# Patient Record
Sex: Male | Born: 1975 | Race: Black or African American | Hispanic: No | Marital: Married | Smoking: Former smoker
Health system: Southern US, Community
[De-identification: ages and names within clinical notes are randomized; demographics above are authoritative.]

## PROBLEM LIST (undated history)

## (undated) DIAGNOSIS — E7800 Pure hypercholesterolemia, unspecified: Secondary | ICD-10-CM

## (undated) DIAGNOSIS — I10 Essential (primary) hypertension: Secondary | ICD-10-CM

## (undated) DIAGNOSIS — E119 Type 2 diabetes mellitus without complications: Secondary | ICD-10-CM

## (undated) HISTORY — DX: Essential (primary) hypertension: I10

## (undated) HISTORY — DX: Pure hypercholesterolemia, unspecified: E78.00

## (undated) HISTORY — DX: Type 2 diabetes mellitus without complications: E11.9

## (undated) HISTORY — PX: OTHER SURGICAL HISTORY: SHX169

---

## 2008-03-23 ENCOUNTER — Encounter: Payer: Self-pay | Admitting: Endocrinology

## 2008-04-14 ENCOUNTER — Encounter: Payer: Self-pay | Admitting: Endocrinology

## 2008-04-20 DIAGNOSIS — Z8719 Personal history of other diseases of the digestive system: Secondary | ICD-10-CM

## 2008-04-20 DIAGNOSIS — M5137 Other intervertebral disc degeneration, lumbosacral region: Secondary | ICD-10-CM

## 2008-04-20 DIAGNOSIS — G609 Hereditary and idiopathic neuropathy, unspecified: Secondary | ICD-10-CM | POA: Insufficient documentation

## 2008-04-20 DIAGNOSIS — K219 Gastro-esophageal reflux disease without esophagitis: Secondary | ICD-10-CM

## 2008-04-21 ENCOUNTER — Ambulatory Visit: Payer: Self-pay | Admitting: Endocrinology

## 2008-04-21 DIAGNOSIS — K7689 Other specified diseases of liver: Secondary | ICD-10-CM

## 2008-04-21 DIAGNOSIS — E119 Type 2 diabetes mellitus without complications: Secondary | ICD-10-CM

## 2008-04-28 ENCOUNTER — Encounter: Payer: Self-pay | Admitting: Endocrinology

## 2008-05-06 ENCOUNTER — Ambulatory Visit: Payer: Self-pay | Admitting: Endocrinology

## 2008-06-11 ENCOUNTER — Ambulatory Visit: Payer: Self-pay | Admitting: Endocrinology

## 2008-06-15 ENCOUNTER — Encounter: Payer: Self-pay | Admitting: Endocrinology

## 2009-02-07 ENCOUNTER — Encounter: Payer: Self-pay | Admitting: Endocrinology

## 2009-02-08 ENCOUNTER — Encounter: Payer: Self-pay | Admitting: Endocrinology

## 2009-02-14 ENCOUNTER — Encounter: Payer: Self-pay | Admitting: Endocrinology

## 2009-02-18 ENCOUNTER — Encounter: Payer: Self-pay | Admitting: Endocrinology

## 2009-02-18 ENCOUNTER — Encounter (INDEPENDENT_AMBULATORY_CARE_PROVIDER_SITE_OTHER): Payer: Self-pay | Admitting: *Deleted

## 2009-02-18 ENCOUNTER — Telehealth: Payer: Self-pay | Admitting: Endocrinology

## 2009-02-19 ENCOUNTER — Encounter: Payer: Self-pay | Admitting: Endocrinology

## 2009-03-10 ENCOUNTER — Ambulatory Visit: Payer: Self-pay | Admitting: Endocrinology

## 2009-03-10 DIAGNOSIS — R079 Chest pain, unspecified: Secondary | ICD-10-CM

## 2009-03-10 DIAGNOSIS — E291 Testicular hypofunction: Secondary | ICD-10-CM

## 2009-03-10 DIAGNOSIS — N529 Male erectile dysfunction, unspecified: Secondary | ICD-10-CM | POA: Insufficient documentation

## 2009-04-11 ENCOUNTER — Ambulatory Visit: Payer: Self-pay | Admitting: Endocrinology

## 2009-04-11 DIAGNOSIS — IMO0001 Reserved for inherently not codable concepts without codable children: Secondary | ICD-10-CM

## 2009-04-11 LAB — CONVERTED CEMR LAB
Prolactin: 12.9 ng/mL
Sed Rate: 23 mm/hr — ABNORMAL HIGH (ref 0–22)
Total CK: 122 units/L (ref 7–232)

## 2009-05-24 ENCOUNTER — Ambulatory Visit: Payer: Self-pay | Admitting: Endocrinology

## 2009-05-24 DIAGNOSIS — M25559 Pain in unspecified hip: Secondary | ICD-10-CM

## 2009-05-24 DIAGNOSIS — R109 Unspecified abdominal pain: Secondary | ICD-10-CM

## 2009-05-25 ENCOUNTER — Telehealth (INDEPENDENT_AMBULATORY_CARE_PROVIDER_SITE_OTHER): Payer: Self-pay | Admitting: *Deleted

## 2009-05-26 ENCOUNTER — Ambulatory Visit: Payer: Self-pay | Admitting: Cardiovascular Disease

## 2009-05-26 LAB — CONVERTED CEMR LAB
Calcium: 9 mg/dL (ref 8.4–10.5)
GFR calc non Af Amer: 165.92 mL/min (ref >60)
Ketones, ur: NEGATIVE mg/dL
Leukocytes, UA: NEGATIVE
Sed Rate: 27 mm/hr — ABNORMAL HIGH (ref 0–22)
Sodium: 143 meq/L (ref 135–145)
Specific Gravity, Urine: 1.02 (ref 1.000–1.030)
Urobilinogen, UA: 1 (ref 0.0–1.0)

## 2009-06-27 ENCOUNTER — Telehealth: Payer: Self-pay | Admitting: Endocrinology

## 2009-07-12 ENCOUNTER — Encounter: Payer: Self-pay | Admitting: Endocrinology

## 2009-07-21 ENCOUNTER — Encounter: Payer: Self-pay | Admitting: Endocrinology

## 2009-08-10 ENCOUNTER — Ambulatory Visit (HOSPITAL_COMMUNITY): Admission: RE | Admit: 2009-08-10 | Discharge: 2009-08-10 | Payer: Self-pay | Admitting: Orthopedic Surgery

## 2009-08-13 ENCOUNTER — Encounter: Admission: RE | Admit: 2009-08-13 | Discharge: 2009-08-13 | Payer: Self-pay | Admitting: Orthopedic Surgery

## 2009-11-09 ENCOUNTER — Ambulatory Visit: Payer: Self-pay | Admitting: Endocrinology

## 2011-01-08 ENCOUNTER — Encounter: Payer: Self-pay | Admitting: Orthopedic Surgery

## 2011-12-18 HISTORY — PX: OTHER SURGICAL HISTORY: SHX169

## 2015-09-13 ENCOUNTER — Encounter: Payer: Self-pay | Admitting: *Deleted

## 2015-09-14 ENCOUNTER — Ambulatory Visit (INDEPENDENT_AMBULATORY_CARE_PROVIDER_SITE_OTHER): Payer: Medicare Other | Admitting: Diagnostic Neuroimaging

## 2015-09-14 ENCOUNTER — Encounter: Payer: Self-pay | Admitting: Diagnostic Neuroimaging

## 2015-09-14 VITALS — BP 128/79 | HR 112 | Ht 71.5 in | Wt 251.8 lb

## 2015-09-14 DIAGNOSIS — M5414 Radiculopathy, thoracic region: Secondary | ICD-10-CM | POA: Diagnosis not present

## 2015-09-14 MED ORDER — GABAPENTIN 300 MG PO CAPS: 300.0000 mg | ORAL_CAPSULE | Freq: Three times a day (TID) | ORAL | Status: DC

## 2015-09-14 NOTE — Patient Instructions (Signed)
Thank you for coming to see [place] at [facility]. I hope we have been able to provide you high quality care today.  You may receive a patient satisfaction survey over the next few weeks. We would appreciate your feedback and comments so that we may continue to improve ourselves and the health of our patients.   - try over the counter capsaicin cream; apply 2-3 x per day; use gloves; try for 2-4 weeks - will prescribe compounded neuropathy cream - try gabapentin 320m at bedtime; gradually increase to 3 times per day; see goodrx.com for discount coupons - I will check MRI thoracic spine   ~~~~~~~~~~~~~~~~~~~~~~~~~~~~~~~~~~~~~~~~~~~~~~~~~~~~~~~~~~~~~~~~~  DR. PENUMALLI'S GUIDE TO HAPPY AND HEALTHY LIVING These are some of my general health and wellness recommendations. Some of them may apply to you better than others. Please use common sense as you try these suggestions and feel free to ask me any questions.   ACTIVITY/FITNESS Mental, social, emotional and physical stimulation are very important for brain and body health. Try learning a new activity (arts, music, language, sports, games).  Keep moving your body to the best of your abilities. You can do this at home, inside or outside, the park, community center, gym or anywhere you like. Consider a physical therapist or personal trainer to get started. Consider the app Sworkit. Fitness trackers such as smart-watches, smart-phones or Fitbits can help as well.   NUTRITION Eat more plants: colorful vegetables, nuts, seeds and berries.  Eat less sugar, salt, preservatives and processed foods.  Avoid toxins such as cigarettes and alcohol.  Drink water when you are thirsty. Warm water with a slice of lemon is an excellent morning drink to start the day.  Consider these websites for more information The Nutrition Source (hhttps://www.henry-hernandez.biz/ [employer] Nutrition  (wWindowBlog.ch   RELAXATION Consider practicing mindfulness meditation or other relaxation techniques such as deep breathing, prayer, yoga, tai chi, massage. See website mindful.org or the apps Headspace or Calm to help get started.   SLEEP Try to get at least 7-8+ hours sleep per day. Regular exercise and reduced caffeine will help you sleep better. Practice good sleep hygeine techniques. See website sleep.org for more information.   PLANNING Prepare estate planning, living will, healthcare POA documents. Sometimes this is best planned with the help of an attorney. Theconversationproject.org and agingwithdignity.org are excellent resources.

## 2015-09-14 NOTE — Progress Notes (Signed)
[facility]  PATIENT: Nathan Garner DOB: Dec 19, 1975  REFERRING CLINICIAN: Margaretmary Bayley HISTORY FROM: patient and wife  REASON FOR VISIT: new consult    HISTORICAL  CHIEF COMPLAINT:  Chief Complaint  Patient presents with  . Dysesthesia    rm 6, New patient, wife- Charlene    HISTORY OF PRESENT ILLNESS:   39 year old right-handed male here for evaluation of pain in the right chest, axillary and back region.  2014 patient had shingles affecting the right flank and abdominal region. He had some sequelae of small rash hyperpigmented regions that have remained. No burning or postherpetic neuralgia pain.  One year ago patient had onset of pain superior to the region of shingles from 2014, affecting just below the right nipple region, radiating around the right lateral and posterior upper thoracic region. Symptoms have been worsening over the past year. He has very hypersensitive skin and even light touch from his clothing causes severe intense burning pain. He has noted no rash in the region of current pain. No prodromal traumas, accidents, rash, infections. Her graft of note patient has diagnosis of hypertension, diabetes, hypercholesterolemia. He has not been on medication for these conditions over the past few years. He did not have insurance coverage and did not take prescription medications as a result. Current hemoglobin A1c is over 13. He is now reestablished with his PCP, has insurance and prescription coverage which should reactivate in November 2016. Patient was prescribed Lyrica for his current pain problems but he has not filled it as the out-of-pocket cost is over $400 per month.  No problems with his upper or lower extremities. No extremity weakness. He has some tingling in his toes.   REVIEW OF SYSTEMS: Full 14 system review of systems performed and notable only for insomnia headache numbness joint pain cramps aching muscles diarrhea shortest of breath  chest pain weight loss fevers chills.  ALLERGIES: Allergies  Allergen Reactions  . Bee Venom Anaphylaxis  . Lisinopril Anaphylaxis    Other reaction(s): DIFFICULTY SWALLOWING  . Promethazine Nausea And Vomiting    Other reaction(s): NAUSEA  . Niacin-Lovastatin Er     REACTION: ANYTHING WITH NIACIN    HOME MEDICATIONS: No outpatient prescriptions prior to visit.   No facility-administered medications prior to visit.    PAST MEDICAL HISTORY: Past Medical History  Diagnosis Date  . Diabetes     type 2  . Hypertension   . Hypercholesterolemia     PAST SURGICAL HISTORY: Past Surgical History  Procedure Laterality Date  . Lap choley  2013  . Abscess removal Bilateral     inner thighs x 7    FAMILY HISTORY: Family History  Problem Relation Age of Onset  . Diabetes Mother   . Cancer Maternal Uncle   . Diabetes Maternal Grandmother   . Hypertension Maternal Grandmother   . Cancer Maternal Grandmother     SOCIAL HISTORY:  Social History   Social History  . Marital Status: Married    Spouse Name: [name]  . Number of Children: 1  . Years of Education: 12   Occupational History  .      [employer]   Social History Main Topics  . Smoking status: Current Some Day Smoker -- 0.30 packs/day for 18 years    Types: Cigarettes  . Smokeless tobacco: Not on file  . Alcohol Use: 0.0 oz/week    0 Standard drinks or equivalent per week     Comment: monthly beer  . Drug  Use: No  . Sexual Activity: Not on file   Other Topics Concern  . Not on file   Social History Narrative   Lives with wife   caffeine use- Monster energy drinks, 2 daily     PHYSICAL EXAM  GENERAL EXAM/CONSTITUTIONAL: Vitals:  Filed Vitals:   09/14/15 1516  BP: 128/79  Pulse: 112  Height: 5' 11.5" (1.816 m)  Weight: 251 lb 12.8 oz (114.216 kg)     Body mass index is 34.63 kg/(m^2).  Visual Acuity Screening   Right eye Left eye Both eyes  Without correction:     With  correction: 20/40 20/20      Patient is in no distress; well developed, nourished and groomed; neck is supple  CARDIOVASCULAR:  Examination of carotid arteries is normal; no carotid bruits  Regular rate and rhythm, no murmurs  Examination of peripheral vascular system by observation and palpation is normal  EYES:  Ophthalmoscopic exam of optic discs and posterior segments is normal; no papilledema or hemorrhages  MUSCULOSKELETAL:  Gait, strength, tone, movements noted in Neurologic exam below  NEUROLOGIC: MENTAL STATUS:  No flowsheet data found.  awake, alert, oriented to person, place and time  recent and remote memory intact  normal attention and concentration  language fluent, comprehension intact, naming intact,   fund of knowledge appropriate  CRANIAL NERVE:   2nd - no papilledema on fundoscopic exam  2nd, 3rd, 4th, 6th - pupils equal and reactive to light, visual fields full to confrontation, extraocular muscles intact, no nystagmus  5th - facial sensation symmetric  7th - facial strength symmetric  8th - hearing intact  9th - palate elevates symmetrically, uvula midline  11th - shoulder shrug symmetric  12th - tongue protrusion midline  MOTOR:   normal bulk and tone, full strength in the BUE, BLE  SENSORY:   normal and symmetric to light touch, pinprick, temperature, vibration; EXCEPT FOR ALLODYNIA IN LEFT T4, T5 REGIONS  COORDINATION:   finger-nose-finger, fine finger movements normal  REFLEXES:   deep tendon reflexes TRACE and symmetric  GAIT/STATION:   narrow based gait; romberg is negative    DIAGNOSTIC DATA (LABS, IMAGING, TESTING) - I reviewed patient records, labs, notes, testing and imaging myself where available.  No results found for: WBC, HGB, HCT, MCV, PLT    Component Value Date/Time   NA 143 05/24/2009 1713   K 4.0 05/24/2009 1713   CL 107 05/24/2009 1713   CO2 30 05/24/2009 1713   GLUCOSE 116* 05/24/2009 1713     BUN 11 05/24/2009 1713   CREATININE 0.6 05/24/2009 1713   CALCIUM 9.0 05/24/2009 1713   GFRNONAA 165.92 05/24/2009 1713   No results found for: CHOL, HDL, LDLCALC, LDLDIRECT, TRIG, CHOLHDL Lab Results  Component Value Date   HGBA1C 9.5* 04/11/2009   No results found for: VITAMINB12 No results found for: TSH   08/13/09 MRI lumbar spine [I reviewed images myself and agree with interpretation. -VRP]  - Minimal disc bulge at L5-S1. Minimal facet degeneration. No neural compressive pathology. The findings could possibly relate to back pain.    ASSESSMENT AND PLAN  38 y.o. year old male here with new onset right thoracic, T4, T5 radiculopathy/neuralgia symptoms since 2015. May represent postherpetic neuralgia, diabetic radiculopathy, compressive radiculopathy. We'll check MRI thoracic spine for further evaluation. We'll try gabapentin and compounded neuropathy cream.  Dx: Thoracic radiculopathy - Plan: MR Thoracic Spine Wo Contrast    PLAN: - try over the counter capsaicin cream; apply 2-3  x per day; use gloves; try for 2-4 weeks - will prescribe compounded neuropathy cream - try gabapentin  at bedtime; gradually increase to 3 times per day; see goodrx.com for discount coupons - I will check MRI thoracic spine  Orders Placed This Encounter  Procedures  . MR Thoracic Spine Wo Contrast    Meds ordered this encounter  Medications  . gabapentin (NEURONTIN) 300 MG capsule    Sig: Take 1 capsule (300 mg total) by mouth 3 (three) times daily.    Dispense:  90 capsule    Refill:  6    Return in about 6 weeks (around 10/26/2015).    Suanne Marker, MD 09/14/2015, 4:20 PM Certified in Neurology, Neurophysiology and Neuroimaging  [facility] [address] [place], [place] [address] [phone]

## 2015-09-15 ENCOUNTER — Encounter: Payer: Self-pay | Admitting: *Deleted

## 2015-09-15 NOTE — Addendum Note (Signed)
Addended by: [place] Pink on: 09/15/2015 08:50 AM   Modules accepted: Medications

## 2015-10-05 ENCOUNTER — Ambulatory Visit
Admission: RE | Admit: 2015-10-05 | Discharge: 2015-10-05 | Disposition: A | Discharge status: Home / Self Care | Payer: Medicare Other | Source: Ambulatory Visit | Attending: Diagnostic Neuroimaging | Admitting: Diagnostic Neuroimaging

## 2015-10-05 DIAGNOSIS — M5414 Radiculopathy, thoracic region: Secondary | ICD-10-CM | POA: Diagnosis not present

## 2015-10-10 ENCOUNTER — Telehealth: Payer: Self-pay | Admitting: Diagnostic Neuroimaging

## 2015-10-10 NOTE — Telephone Encounter (Signed)
Continue increasing gabapentin up to 300mg  TID for 1-2 weeks, then up to 600mg  TID.

## 2015-10-10 NOTE — Telephone Encounter (Signed)
Spoke with patient and informed him of Dr Penumalli's response. Advised he may choose to increase Gabapentin to 600 mg at night only to see if it is helpful. Advised he take 300 mg TID x 1 week at minimum before he increases and adjust dose according to how well his pain is being controlled. Advised he call back with any questions and touch base in 1-2 weeks if he has not heard back from this RN before then. He verbalized understanding, appreciation.

## 2015-10-10 NOTE — Telephone Encounter (Signed)
Spoke with patient and informed him that thoracic MRI results (without contrast) are normal. He states the compounded cream is "only helpful for about 10 minutes". He increased Gabapentin to 300 mg BID approximately 3 days after he began medication. He has not increased to 300 mg TID. He states his pain has "spread to his entire trunk and back and into his lower back". He states "the pain is so bad sometimes it makes my back sweat". Advised he increase Gabapentin to 300 mg TID and this RN will route his c/o to Dr Marjory LiesPenumalli for further instructions, information. Informed patient would try to call him back by close today, but definitely by 5 pm tomorrow. He verbalized understanding, appreciation for call.

## 2015-10-10 NOTE — Telephone Encounter (Signed)
Pt's wife called on behalf to the pt. He would like to know results of his MRI. Please call and advise [phone]

## 2015-10-10 NOTE — Telephone Encounter (Signed)
Agree with plan. --VRP 

## 2015-10-27 ENCOUNTER — Encounter: Payer: Self-pay | Admitting: Diagnostic Neuroimaging

## 2015-10-27 ENCOUNTER — Ambulatory Visit (INDEPENDENT_AMBULATORY_CARE_PROVIDER_SITE_OTHER): Payer: Medicare Other | Admitting: Diagnostic Neuroimaging

## 2015-10-27 VITALS — BP 153/85 | HR 104 | Ht 71.5 in | Wt 254.0 lb

## 2015-10-27 DIAGNOSIS — R1011 Right upper quadrant pain: Secondary | ICD-10-CM | POA: Diagnosis not present

## 2015-10-27 DIAGNOSIS — R0789 Other chest pain: Secondary | ICD-10-CM

## 2015-10-27 DIAGNOSIS — R1032 Left lower quadrant pain: Secondary | ICD-10-CM | POA: Diagnosis not present

## 2015-10-27 MED ORDER — PREGABALIN 100 MG PO CAPS: 100.0000 mg | ORAL_CAPSULE | Freq: Two times a day (BID) | ORAL | Status: DC

## 2015-10-27 NOTE — Progress Notes (Signed)
[facility]  PATIENT: Nathan DellClifton Netz DOB: [birth year]  REFERRING CLINICIAN: Margaretmary BayleyPreston Clark HISTORY FROM: patient and wife  REASON FOR VISIT: follow up    HISTORICAL  CHIEF COMPLAINT:  Chief Complaint  Patient presents with  . Thoracic radiculopathy    rm 6  . Follow-up    6 week    HISTORY OF PRESENT ILLNESS:   UPDATE 10/27/15: Since last visit, continutes with severe pain. Now with increasing pain in left abdomen. More tachycardia and diaphoresis. Now on lyrica (now has insurance).   PRIOR HPI (09/14/15): 39 year old right-handed male here for evaluation of pain in the right chest, axillary and back region. 2014 patient had shingles affecting the right flank and abdominal region. He had some sequelae of small rash hyperpigmented regions that have remained. No burning or postherpetic neuralgia pain. One year ago patient had onset of pain superior to the region of shingles from 2014, affecting just below the right nipple region, radiating around the right lateral and posterior upper thoracic region. Symptoms have been worsening over the past year. He has very hypersensitive skin and even light touch from his clothing causes severe intense burning pain. He has noted no rash in the region of current pain. No prodromal traumas, accidents, rash, infections. Her graft of note patient has diagnosis of hypertension, diabetes, hypercholesterolemia. He has not been on medication for these conditions over the past few years. He did not have insurance coverage and did not take prescription medications as a result. Current hemoglobin A1c is over 13. He is now reestablished with his PCP, has insurance and prescription coverage which should reactivate in November 2016. Patient was prescribed Lyrica for his current pain problems but he has not filled it as the out-of-pocket cost is over $400 per month. No problems with his upper or lower extremities. No extremity weakness. He has some  tingling in his toes.   REVIEW OF SYSTEMS: Full 14 system review of systems performed and notable only for as per HPI.    ALLERGIES: Allergies  Allergen Reactions  . Bee Venom Anaphylaxis  . Lisinopril Anaphylaxis    Other reaction(s): DIFFICULTY SWALLOWING  . Promethazine Nausea And Vomiting    Other reaction(s): NAUSEA  . Niacin-Lovastatin Er     REACTION: ANYTHING WITH NIACIN    HOME MEDICATIONS: Outpatient Prescriptions Prior to Visit  Medication Sig Dispense Refill  . Linagliptin-Metformin HCl (JENTADUETO PO) Take 1,000 mg by mouth 2 (two) times daily.    . NONFORMULARY OR COMPOUNDED ITEM Baclofen 2%, Gabapentin 6%, Imipramine 3%, Lidocaine 2%, Nifedipine 2% Neuropathic/post herpetic Neuralgia Cream, [facility] refills    . gabapentin (NEURONTIN) 300 MG capsule Take 1 capsule (300 mg total) by mouth 3 (three) times daily. (Patient not taking: Reported on 10/27/2015) 90 capsule 6   No facility-administered medications prior to visit.    PAST MEDICAL HISTORY: Past Medical History  Diagnosis Date  . Diabetes (HCC)     type 2  . Hypertension   . Hypercholesterolemia     PAST SURGICAL HISTORY: Past Surgical History  Procedure Laterality Date  . Lap choley  2013  . Abscess removal Bilateral     inner thighs x 7    FAMILY HISTORY: Family History  Problem Relation Age of Onset  . Diabetes Mother   . Cancer Maternal Uncle   . Diabetes Maternal Grandmother   . Hypertension Maternal Grandmother   . Cancer Maternal Grandmother     SOCIAL HISTORY:  Social History   Social  History  . Marital Status: Married    Spouse Name: [name]  . Number of Children: 1  . Years of Education: 12   Occupational History  .      [employer]   Social History Main Topics  . Smoking status: Current Some Day Smoker -- 0.30 packs/day for 18 years    Types: Cigarettes  . Smokeless tobacco: Not on file  . Alcohol Use: 0.0 oz/week    0 Standard drinks or  equivalent per week     Comment: monthly beer  . Drug Use: No  . Sexual Activity: Not on file   Other Topics Concern  . Not on file   Social History Narrative   Lives with wife   caffeine use- Monster energy drinks, 2 daily     PHYSICAL EXAM  GENERAL EXAM/CONSTITUTIONAL: Vitals:  Filed Vitals:   10/27/15 1546  BP: 153/85  Pulse: 104  Height: 5' 11.5" (1.816 m)  Weight: 254 lb (115.214 kg)   Body mass index is 34.94 kg/(m^2). No exam data present    Patient is UNCOMFORTABLE APPEARING; DIAPHORETIC   Well developed, nourished and groomed; neck is supple  CARDIOVASCULAR:  Examination of carotid arteries is normal; no carotid bruits  Regular rate and rhythm, no murmurs  Examination of peripheral vascular system by observation and palpation is normal  EYES:  Ophthalmoscopic exam of optic discs and posterior segments is normal; no papilledema or hemorrhages  MUSCULOSKELETAL:  Gait, strength, tone, movements noted in Neurologic exam below  NEUROLOGIC: MENTAL STATUS:  No flowsheet data found.  awake, alert, oriented to person, place and time  recent and remote memory intact  normal attention and concentration  language fluent, comprehension intact, naming intact,   fund of knowledge appropriate  CRANIAL NERVE:   2nd - no papilledema on fundoscopic exam  2nd, 3rd, 4th, 6th - pupils equal and reactive to light, visual fields full to confrontation, extraocular muscles intact, no nystagmus  5th - facial sensation symmetric  7th - facial strength symmetric  8th - hearing intact  9th - palate elevates symmetrically, uvula midline  11th - shoulder shrug symmetric  12th - tongue protrusion midline  MOTOR:   normal bulk and tone, full strength in the BUE, BLE  SENSORY:   normal and symmetric to light touch; SENSITIVE IN RIGHT T4,5 AND LEFT T9-11 REGIONS  COORDINATION:   finger-nose-finger, fine finger movements normal  REFLEXES:   deep  tendon reflexes TRACE and symmetric  GAIT/STATION:   narrow based gait; romberg is negative    DIAGNOSTIC DATA (LABS, IMAGING, TESTING) - I reviewed patient records, labs, notes, testing and imaging myself where available.  No results found for: WBC, HGB, HCT, MCV, PLT    Component Value Date/Time   NA 143 05/24/2009 1713   K 4.0 05/24/2009 1713   CL 107 05/24/2009 1713   CO2 30 05/24/2009 1713   GLUCOSE 116* 05/24/2009 1713   BUN 11 05/24/2009 1713   CREATININE 0.6 05/24/2009 1713   CALCIUM 9.0 05/24/2009 1713   GFRNONAA 165.92 05/24/2009 1713   No results found for: CHOL, HDL, LDLCALC, LDLDIRECT, TRIG, CHOLHDL Lab Results  Component Value Date   HGBA1C 9.5* 04/11/2009   No results found for: VITAMINB12 No results found for: TSH    08/13/09 MRI lumbar spine [I reviewed images myself and agree with interpretation. -VRP]  - Minimal disc bulge at L5-S1. Minimal facet degeneration. No neural compressive pathology. The findings could possibly relate to back pain.  10/05/15 MRI thoracic spine  - normal      ASSESSMENT AND PLAN  39 y.o. year old male here with new onset right thoracic, T4, T5 radiculopathy/neuralgia symptoms since 2015. May represent diabetic radiculopathy. Now with increasing left abdomen pain, tachycardia, diaphoresis.    Dx:  Left lower quadrant pain - Plan: CT Abdomen W Contrast, CANCELED: CT Abdomen Pelvis W Contrast  Other chest pain - Plan: CT ANGIO CHEST AORTA W/CM &/OR WO/CM, CANCELED: CT Abdomen Pelvis W Contrast  Right upper quadrant pain - Plan: CT Abdomen W Contrast, CT ANGIO CHEST AORTA W/CM &/OR WO/CM, CANCELED: CT Abdomen Pelvis W Contrast    PLAN: - increase lyrica to  BID - continue compounded neuropathy cream - check CT chest and abdomen (rule out aorta dissection; rule out abdomen pathology)  Orders Placed This Encounter  Procedures  . CT Abdomen W Contrast  . CT ANGIO CHEST AORTA W/CM &/OR WO/CM   Meds  ordered this encounter  Medications  . pregabalin (LYRICA) 100 MG capsule    Sig: Take 1 capsule (100 mg total) by mouth 2 (two) times daily.    Dispense:  60 capsule    Refill:  5   Return in about 6 weeks (around 12/08/2015).    Suanne Marker, MD 10/27/2015, 4:52 PM Certified in Neurology, Neurophysiology and Neuroimaging  [facility] [address] [place], [place] [address] [phone]

## 2015-10-27 NOTE — Patient Instructions (Signed)
-   increase lyrica to 100mg  twice a day - I will check CT abdomen

## 2015-12-01 ENCOUNTER — Ambulatory Visit (INDEPENDENT_AMBULATORY_CARE_PROVIDER_SITE_OTHER): Payer: Self-pay | Admitting: Family Medicine

## 2015-12-01 VITALS — BP 138/90 | HR 103 | Temp 97.7°F | Resp 17 | Ht 70.0 in | Wt 246.0 lb

## 2015-12-01 DIAGNOSIS — IMO0002 Reserved for concepts with insufficient information to code with codable children: Secondary | ICD-10-CM

## 2015-12-01 DIAGNOSIS — Z23 Encounter for immunization: Secondary | ICD-10-CM

## 2015-12-01 DIAGNOSIS — IMO0001 Reserved for inherently not codable concepts without codable children: Secondary | ICD-10-CM

## 2015-12-01 DIAGNOSIS — W278XXA Contact with other nonpowered hand tool, initial encounter: Secondary | ICD-10-CM

## 2015-12-01 DIAGNOSIS — Z114 Encounter for screening for human immunodeficiency virus [HIV]: Secondary | ICD-10-CM

## 2015-12-01 DIAGNOSIS — S61439A Puncture wound without foreign body of unspecified hand, initial encounter: Secondary | ICD-10-CM

## 2015-12-01 DIAGNOSIS — S61432A Puncture wound without foreign body of left hand, initial encounter: Secondary | ICD-10-CM

## 2015-12-01 DIAGNOSIS — S6982XA Other specified injuries of left wrist, hand and finger(s), initial encounter: Secondary | ICD-10-CM

## 2015-12-01 NOTE — Progress Notes (Signed)
Subjective:  This chart was scribed for Meredith Staggers, MD by Stann Ore, Medical Scribe. This patient was seen in room 13 and the patient's care was started 6:17 PM.   Patient ID: Nathan Garner, male    DOB: 05-15-1976, 39 y.o.   MRN: [id number] Chief Complaint  Patient presents with  . Hand Injury    left hand injury , has a needle inside the nail    HPI Nathan Garner is a 39 y.o. male  Pt reports of left hand injury into his left thumb and right hand today. He was moving furniture for work today in [place]. While he was moving a couch, an insulin needle stabbed into his left thumb, and lantus punctured into his right palm with blood in both punctures. The client was diabetic without knowledge to the patient until incident. The patient was wearing nitrile coated cloth gloves and the needle and the lantus went through the gloves. He immediately took his gloves off and removed the needle in his thumb with tweezers. He used rubbing alcohol, washed his hands with soap and water, and applied neosporin to the area.   The patient did note that the client was an older gentleman and wasn't expecting the patient to arrive to his home so early.   His last tdap was more than 5 years ago.  He works for [employer].   Patient Active Problem List   Diagnosis Date Noted  . HIP PAIN, LEFT 05/24/2009  . PELVIC  PAIN 05/24/2009  . MYALGIA 04/11/2009  . HYPOGONADISM 03/10/2009  . 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 CHEST PAIN 03/10/2009  . DM 04/21/2008  . FATTY LIVER DISEASE 04/21/2008  . PERIPHERAL NEUROPATHY 04/20/2008  . GERD 04/20/2008  . DEGENERATIVE DISC DISEASE, LUMBOSACRAL SPINE 04/20/2008  . HEMORRHOIDS, HX OF 04/20/2008   Past Medical History  Diagnosis Date  . Diabetes (HCC)     type 2  . Hypertension   . Hypercholesterolemia    Past Surgical History  Procedure Laterality Date  . Lap choley  2013  . Abscess removal Bilateral     inner thighs x 7   Allergies    Allergen Reactions  . Bee Venom Anaphylaxis  . Lisinopril Anaphylaxis    Other reaction(s): DIFFICULTY SWALLOWING  . Promethazine Nausea And Vomiting    Other reaction(s): NAUSEA  . Niacin-Lovastatin Er     REACTION: ANYTHING WITH NIACIN   Prior to Admission medications   Medication Sig Start Date End Date Taking? Authorizing Provider  lidocaine (XYLOCAINE) 5 % ointment Reported on 12/01/2015 10/25/15   Historical Provider, MD  Linagliptin-Metformin HCl (JENTADUETO PO) Take 1,000 mg by mouth 2 (two) times daily. Reported on 12/01/2015    Historical Provider, MD  NONFORMULARY OR COMPOUNDED ITEM Reported on 12/01/2015 09/15/15   Historical Provider, MD  pregabalin (LYRICA) 100 MG capsule Take 1 capsule (100 mg total) by mouth 2 (two) times daily. Patient not taking: Reported on 12/01/2015 10/27/15   Suanne Marker, MD   Social History   Social History  . Marital Status: Married    Spouse Name: [name]  . Number of Children: 1  . Years of Education: 12   Occupational History  .      [employer]   Social History Main Topics  . Smoking status: Current Some Day Smoker -- 0.30 packs/day for 18 years    Types: Cigarettes  . Smokeless tobacco: Not on file  . Alcohol Use: 0.0 oz/week  0 Standard drinks or equivalent per week     Comment: monthly beer  . Drug Use: No  . Sexual Activity: Not on file   Other Topics Concern  . Not on file   Social History Narrative   Lives with wife   caffeine use- Monster energy drinks, 2 daily   Review of Systems  Constitutional: Negative for fever, chills and fatigue.  Gastrointestinal: Negative for nausea, vomiting, diarrhea and constipation.  Musculoskeletal: Negative for myalgias, joint swelling and arthralgias.  Skin: Positive for wound. Negative for rash.  Neurological: Negative for dizziness.      Objective:   Physical Exam  Constitutional: He is oriented to person, place, and time. He appears well-developed and  well-nourished. No distress.  HENT:  Head: Normocephalic and atraumatic.  Eyes: EOM are normal. Pupils are equal, round, and reactive to light.  Neck: Neck supple.  Cardiovascular: Normal rate.   Pulmonary/Chest: Effort normal. No respiratory distress.  Musculoskeletal: Normal range of motion.  Left thumb, radial border: slight spacing and nail lifting from what-appears to be an injury, no active bleeding, no discharge, remainder of nail bed intact, no foreign body noted  Right hand: small puncture at the volar aspect of the hand overlying 3rd mcp, no bleeding, no active discharge, no foreign body noted  Neurological: He is alert and oriented to person, place, and time.  Skin: Skin is warm and dry.  Psychiatric: He has a normal mood and affect. His behavior is normal.  Nursing note and vitals reviewed.   Filed Vitals:   12/01/15 1806  BP: 138/90  Pulse: 103  Temp: 97.7 F (36.5 C)  TempSrc: Oral  Resp: 17  Height: 5\' 10"  (1.778 m)  Weight: 246 lb (111.585 kg)  SpO2: 98%      Assessment & Plan:  Nathan LongestClifton T Nathan Garner is a 39 y.o. male Puncture wound, hand, unspecified laterality, initial encounter - Plan: HIV antibody, ALT, Hepatitis C antibody, Hepatitis B surface antibody, Hepatitis B surface antigen  Needlestick wound of finger, left, initial encounter - Plan: HIV antibody, ALT, Hepatitis C antibody, Hepatitis B surface antibody, Hepatitis B surface antigen  Needlestick injury accident - Plan: HIV antibody, ALT, Hepatitis C antibody, Hepatitis B surface antibody, Hepatitis B surface antigen  Screening for HIV (human immunodeficiency virus) - Plan: HIV antibody, ALT, Hepatitis C antibody, Hepatitis B surface antibody, Hepatitis B surface antigen  Need for Tdap vaccination - Plan: Tdap vaccine greater than or equal to 7yo IM  Accidental needlestick to thumb and lancet puncture to volar hand as above. Source patient status unknown. His employer will attempt to get this information,  but for now - baseline/screening labs above. Lower risk or disease transmission based on insulin needle, but discussed still present risk.  Discussed with Benny LennertSarah Weber, PA-C who will try to assist in next 2 days to obtain labs from source patient. rtc precautions discussed including potential repeat labs in 6 weeks. Understanding expressed.   No orders of the defined types were placed in this encounter.   Patient Instructions  As discussed, low risk of exposure to infection based on the type of injury you had, but will check for baseline labs in both you and ideally the client's blood work as he would be the source patient.  Benny LennertSarah Weber will be in touch with you tomorrow, so if possible try to obtain the information from the client so that she can call him regarding the next step and further testing. I have given you both  her card and my card for Nurse, children's.  Tetanus vaccine was given tonight.   Okay to continue cleansing wounds with soap and water, Polysporin or Neosporin okay if needed, but a simple bandage keeping them clean and covered at work should suffice.  Return to the clinic or go to the nearest emergency room if any of your symptoms worsen or new symptoms occur.        By signing my name below, I, Stann Ore, attest that this documentation has been prepared under the direction and in the presence of Meredith Staggers, MD. Electronically Signed: Stann Ore, Scribe. 12/01/2015 , 6:17 PM .  I personally performed the services described in this documentation, which was scribed in my presence. The recorded information has been reviewed and considered, and addended by me as needed.

## 2015-12-01 NOTE — Patient Instructions (Signed)
As discussed, low risk of exposure to infection based on the type of injury you had, but will check for baseline labs in both you and ideally the client's blood work as he would be the source patient.  Nathan LennertSarah Garner will be in touch with you tomorrow, so if possible try to obtain the information from the client so that she can call him regarding the next step and further testing. I have given you both her card and my card for your manager.  Tetanus vaccine was given tonight.   Okay to continue cleansing wounds with soap and water, Polysporin or Neosporin okay if needed, but a simple bandage keeping them clean and covered at work should suffice.  Return to the clinic or go to the nearest emergency room if any of your symptoms worsen or new symptoms occur.

## 2015-12-02 ENCOUNTER — Telehealth: Payer: Self-pay | Admitting: Physician Assistant

## 2015-12-02 DIAGNOSIS — Z7721 Contact with and (suspected) exposure to potentially hazardous body fluids: Secondary | ICD-10-CM

## 2015-12-02 DIAGNOSIS — IMO0001 Reserved for inherently not codable concepts without codable children: Secondary | ICD-10-CM

## 2015-12-02 DIAGNOSIS — S6981XA Other specified injuries of right wrist, hand and finger(s), initial encounter: Secondary | ICD-10-CM

## 2015-12-02 LAB — ALT: ALT: 18 U/L (ref 9–46)

## 2015-12-02 MED ORDER — EMTRICITABINE-TENOFOVIR DF 200-300 MG PO TABS: 1.0000 | ORAL_TABLET | Freq: Every day | ORAL | Status: DC

## 2015-12-02 MED ORDER — RALTEGRAVIR POTASSIUM 400 MG PO TABS: 400.0000 mg | ORAL_TABLET | Freq: Two times a day (BID) | ORAL | Status: DC

## 2015-12-02 NOTE — Telephone Encounter (Signed)
Spoke with patient - his company talked to the source patient today and he is not willing to cooperate and told them he does not want to be contacted by them again.  I spoke with the exposed patient and we talked about risk vs benefits of HIV prophylaxis and possible SE and he would like to start the medication.  He will come to the clinic tomorrow to have his labs drawn but it is ok to start the medication tonight.  I answered his questions and he agreed with the plan.  He will RTC in 2 weeks for recheck.

## 2015-12-03 ENCOUNTER — Other Ambulatory Visit (INDEPENDENT_AMBULATORY_CARE_PROVIDER_SITE_OTHER): Payer: Self-pay

## 2015-12-03 DIAGNOSIS — Z7721 Contact with and (suspected) exposure to potentially hazardous body fluids: Secondary | ICD-10-CM

## 2015-12-03 DIAGNOSIS — R0789 Other chest pain: Secondary | ICD-10-CM

## 2015-12-03 DIAGNOSIS — R1032 Left lower quadrant pain: Secondary | ICD-10-CM

## 2015-12-03 DIAGNOSIS — R1011 Right upper quadrant pain: Secondary | ICD-10-CM

## 2015-12-03 LAB — CBC WITH DIFFERENTIAL/PLATELET
Basophils Absolute: 0 10*3/uL (ref 0.0–0.1)
Basophils Relative: 0 % (ref 0–1)
Eosinophils Absolute: 0 10*3/uL (ref 0.0–0.7)
Eosinophils Relative: 0 % (ref 0–5)
HCT: 45.2 % (ref 39.0–52.0)
HEMOGLOBIN: 14.8 g/dL (ref 13.0–17.0)
LYMPHS ABS: 2.2 10*3/uL (ref 0.7–4.0)
Lymphocytes Relative: 24 % (ref 12–46)
MCH: 28.3 pg (ref 26.0–34.0)
MCHC: 32.7 g/dL (ref 30.0–36.0)
MCV: 86.4 fL (ref 78.0–100.0)
MONOS PCT: 5 % (ref 3–12)
MPV: 9.4 fL (ref 8.6–12.4)
Monocytes Absolute: 0.5 10*3/uL (ref 0.1–1.0)
NEUTROS ABS: 6.5 10*3/uL (ref 1.7–7.7)
NEUTROS PCT: 71 % (ref 43–77)
Platelets: 330 10*3/uL (ref 150–400)
RBC: 5.23 MIL/uL (ref 4.22–5.81)
RDW: 14 % (ref 11.5–15.5)
WBC: 9.2 10*3/uL (ref 4.0–10.5)

## 2015-12-03 LAB — COMPLETE METABOLIC PANEL WITH GFR
ALT: 16 U/L (ref 9–46)
AST: 12 U/L (ref 10–40)
Albumin: 4.1 g/dL (ref 3.6–5.1)
Alkaline Phosphatase: 85 U/L (ref 40–115)
BILIRUBIN TOTAL: 0.3 mg/dL (ref 0.2–1.2)
BUN: 10 mg/dL (ref 7–25)
CALCIUM: 8.8 mg/dL (ref 8.6–10.3)
CO2: 26 mmol/L (ref 20–31)
CREATININE: 0.74 mg/dL (ref 0.60–1.35)
Chloride: 100 mmol/L (ref 98–110)
GFR, Est Non African American: >89 mL/min (ref >=60)
Glucose, Bld: 300 mg/dL — ABNORMAL HIGH (ref 65–99)
Potassium: 4.3 mmol/L (ref 3.5–5.3)
Sodium: 135 mmol/L (ref 135–146)
TOTAL PROTEIN: 6.7 g/dL (ref 6.1–8.1)

## 2015-12-03 LAB — HIV ANTIBODY (ROUTINE TESTING W REFLEX): HIV 1&2 Ab, 4th Generation: NONREACTIVE

## 2015-12-03 LAB — AMYLASE: AMYLASE: 28 U/L (ref 0–105)

## 2015-12-03 LAB — HEPATITIS B SURFACE ANTIBODY, QUANTITATIVE: Hepatitis B-Post: 0 m[IU]/mL

## 2015-12-03 LAB — HEPATITIS B SURFACE ANTIGEN: Hepatitis B Surface Ag: NEGATIVE

## 2015-12-03 LAB — HEPATITIS C ANTIBODY: HCV Ab: NEGATIVE

## 2015-12-03 NOTE — Progress Notes (Signed)
Pt. Came in today for a lab only visit

## 2015-12-05 ENCOUNTER — Other Ambulatory Visit: Payer: Self-pay | Admitting: Physician Assistant

## 2015-12-05 DIAGNOSIS — Z23 Encounter for immunization: Secondary | ICD-10-CM

## 2015-12-07 ENCOUNTER — Ambulatory Visit: Payer: Medicare Other | Admitting: Diagnostic Neuroimaging

## 2015-12-08 ENCOUNTER — Encounter: Payer: Self-pay | Admitting: Diagnostic Neuroimaging

## 2015-12-13 ENCOUNTER — Ambulatory Visit (INDEPENDENT_AMBULATORY_CARE_PROVIDER_SITE_OTHER): Payer: Medicare Other | Admitting: Family Medicine

## 2015-12-13 VITALS — BP 132/90 | HR 96 | Temp 98.3°F | Resp 16 | Ht 70.5 in | Wt 246.8 lb

## 2015-12-13 DIAGNOSIS — IMO0001 Reserved for inherently not codable concepts without codable children: Secondary | ICD-10-CM

## 2015-12-13 DIAGNOSIS — E1165 Type 2 diabetes mellitus with hyperglycemia: Secondary | ICD-10-CM

## 2015-12-13 LAB — POCT GLYCOSYLATED HEMOGLOBIN (HGB A1C): Hemoglobin A1C: >=14

## 2015-12-13 MED ORDER — METFORMIN HCL 500 MG PO TABS
500.0000 mg | ORAL_TABLET | Freq: Two times a day (BID) | ORAL | Status: DC
Start: 2015-12-13 — End: 2022-02-13

## 2015-12-13 MED ORDER — INSULIN GLARGINE 100 UNIT/ML ~~LOC~~ SOLN: SUBCUTANEOUS | Status: DC

## 2015-12-13 NOTE — Progress Notes (Signed)
[facility] [address], [place] [address] [phone]  Date:  12/13/2015   Name:  Nathan LongestClifton T Glorioso   DOB:  December 15, 1976   MRN:  [id number]  PCP:  Agustina CaroliHICKS, KRISTIN D, MD    Chief Complaint: Follow-up   History of Present Illness:  Nathan Garner is a 39 y.o. very pleasant male patient who presents with the following:  He was here due to a work needestick exposure about 10 days ago- one his screening labs he was noted to have high glucose. Here today to follow-up on this.  He has av known history of DM- he was dx in 2007 He was on lantus 30u BID and SS humalog.  He was also on metformin - however he has not been on any medications for about 18 months.  He lost his insurance and had to stop following up  He does have a family history of DM He last ate about 8 hours ago today.   His most recent A1c was 13.1 from a couple of weeks ago  He feels fine today- no acute sx Here today with his wife Patient Active Problem List   Diagnosis Date Noted  . HIP PAIN, LEFT 05/24/2009  . PELVIC  PAIN 05/24/2009  . MYALGIA 04/11/2009  . HYPOGONADISM 03/10/2009  . 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 CHEST PAIN 03/10/2009  . DM 04/21/2008  . FATTY LIVER DISEASE 04/21/2008  . PERIPHERAL NEUROPATHY 04/20/2008  . GERD 04/20/2008  . DEGENERATIVE DISC DISEASE, LUMBOSACRAL SPINE 04/20/2008  . HEMORRHOIDS, HX OF 04/20/2008    Past Medical History  Diagnosis Date  . Diabetes (HCC)     type 2  . Hypertension   . Hypercholesterolemia     Past Surgical History  Procedure Laterality Date  . Lap choley  2013  . Abscess removal Bilateral     inner thighs x 7    Social History  Substance Use Topics  . Smoking status: Current Some Day Smoker -- 0.30 packs/day for 18 years    Types: Cigarettes  . Smokeless tobacco: None  . Alcohol Use: 0.0 oz/week    0 Standard drinks or equivalent per week     Comment: monthly beer    Family History  Problem Relation Age of  Onset  . Diabetes Mother   . Cancer Maternal Uncle   . Diabetes Maternal Grandmother   . Hypertension Maternal Grandmother   . Cancer Maternal Grandmother     Allergies  Allergen Reactions  . Bee Venom Anaphylaxis  . Lisinopril Anaphylaxis    Other reaction(s): DIFFICULTY SWALLOWING  . Promethazine Nausea And Vomiting    Other reaction(s): NAUSEA  . Niacin-Lovastatin Er     REACTION: ANYTHING WITH NIACIN    Medication list has been reviewed and updated.  Current Outpatient Prescriptions on File Prior to Visit  Medication Sig Dispense Refill  . emtricitabine-tenofovir (TRUVADA) 200-300 MG tablet Take 1 tablet by mouth daily. 30 tablet 0  . lidocaine (XYLOCAINE) 5 % ointment Reported on 12/13/2015    . Linagliptin-Metformin HCl (JENTADUETO PO) Take 1,000 mg by mouth 2 (two) times daily. Reported on 12/13/2015    . NONFORMULARY OR COMPOUNDED ITEM Reported on 12/13/2015    . pregabalin (LYRICA) 100 MG capsule Take 1 capsule (100 mg total) by mouth 2 (two) times daily. (Patient not taking: Reported on 12/01/2015) 60 capsule 5  . raltegravir (ISENTRESS) 400 MG tablet Take 1 tablet (400 mg total) by mouth 2 (two) times  daily. (Patient not taking: Reported on 12/13/2015) 60 tablet 0   No current facility-administered medications on file prior to visit.    Review of Systems:  As per HPI- otherwise negative.   Physical Examination: Filed Vitals:   12/13/15 2006  BP: 142/9  Pulse: 121  Temp: 98.3 F (36.8 C)  Resp: 16   Filed Vitals:   12/13/15 2006  Height: 5' 10.5" (1.791 m)  Weight: 246 lb 12.8 oz (111.948 kg)   Body mass index is 34.9 kg/(m^2). Ideal Body Weight: Weight in (lb) to have BMI = 25: 176.4  GEN: WDWN, NAD, Non-toxic, A & O x 3, overweight, looks well HEENT: Atraumatic, Normocephalic. Neck supple. No masses, No LAD. Ears and Nose: No external deformity. CV: RRR, No M/G/R. No JVD. No thrill. No extra heart sounds. PULM: CTA B, no wheezes, crackles,  rhonchi. No retractions. No resp. distress. No accessory muscle use. EXTR: No c/c/e NEURO Normal gait.  PSYCH: Normally interactive. Conversant. Not depressed or anxious appearing.  Calm demeanor.   Results for orders placed or performed in visit on 12/13/15  POCT glycosylated hemoglobin (Hb A1C)  Result Value Ref Range   Hemoglobin A1C =>14.0     Assessment and Plan: Uncontrolled diabetes mellitus type 2 without complications, unspecified long term insulin use status (HCC) - Plan: POCT glycosylated hemoglobin (Hb A1C), Lipid panel, Microalbumin, urine, insulin glargine (LANTUS) 100 UNIT/ML injection, metFORMIN (GLUCOPHAGE) 500 MG tablet  Here today to follow-up on known history of DM- he has been off his medication for over a year and his A1c is quite high.  He is currently on HIV prophylaxis following a needle stick.  He will start back on lantus and taper up his dose gradually.  Defer restating metformin until off the HIV meds due to increased risk of toxicity Recent CMP showed anion gap of 9 so his body has adjusted to high glucose and he is not in DKA  Signed Abbe Amsterdam, MD

## 2015-12-13 NOTE — Patient Instructions (Signed)
We are going to start you back on lantus- start with 10 units. As long as your fasting blood sugar is over 150 increase by 2-3 units every 3 days.  Once your finish the HIV prophylaxis medication you can start on the metformin- once a day for 2-3 days then twice a day  Please see us in a couple of weeks for a recheck

## 2015-12-14 LAB — LIPID PANEL
CHOL/HDL RATIO: 3.7 ratio (ref <=5.0)
Cholesterol: 168 mg/dL (ref 125–200)
HDL: 46 mg/dL (ref >=40)
LDL CALC: 77 mg/dL (ref <130)
Triglycerides: 227 mg/dL — ABNORMAL HIGH (ref <150)
VLDL: 45 mg/dL — ABNORMAL HIGH (ref <30)

## 2015-12-14 LAB — MICROALBUMIN, URINE: MICROALB UR: 0.3 mg/dL

## 2015-12-15 ENCOUNTER — Encounter: Payer: Self-pay | Admitting: Family Medicine

## 2016-02-05 ENCOUNTER — Encounter: Payer: Self-pay | Admitting: Family Medicine

## 2021-09-12 ENCOUNTER — Ambulatory Visit (INDEPENDENT_AMBULATORY_CARE_PROVIDER_SITE_OTHER): Payer: BC Managed Care – PPO | Admitting: Ophthalmology

## 2021-09-12 ENCOUNTER — Other Ambulatory Visit: Payer: Self-pay

## 2021-09-12 ENCOUNTER — Encounter (INDEPENDENT_AMBULATORY_CARE_PROVIDER_SITE_OTHER): Payer: Self-pay | Admitting: Ophthalmology

## 2021-09-12 DIAGNOSIS — E113591 Type 2 diabetes mellitus with proliferative diabetic retinopathy without macular edema, right eye: Secondary | ICD-10-CM | POA: Diagnosis not present

## 2021-09-12 DIAGNOSIS — E113552 Type 2 diabetes mellitus with stable proliferative diabetic retinopathy, left eye: Secondary | ICD-10-CM | POA: Diagnosis not present

## 2021-09-12 DIAGNOSIS — H4311 Vitreous hemorrhage, right eye: Secondary | ICD-10-CM

## 2021-09-12 NOTE — Assessment & Plan Note (Addendum)
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  And does not therefore need any further therapy.   Will attempt to acquire records

## 2021-09-12 NOTE — Assessment & Plan Note (Addendum)
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 I explained to the patient that laser treatment is required regions of prior nontreatment in order to quiet down the entire eye however he should know that the current bleeding will not subside quickly.  We are trying to prevent new bleeding from developing.  If the current bleeding spots persist or worsen surgical intervention by retina surgeon could be undertaken to clear this out as well as very remove all scar tissue from the active blood vessels growing  This part of note dictated in presence of patient using as many non medical terms as possible for his understanding.

## 2021-09-12 NOTE — Assessment & Plan Note (Signed)
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

## 2021-09-12 NOTE — Progress Notes (Signed)
09/12/2021     CHIEF COMPLAINT Patient presents for  Chief Complaint  Patient presents with   Eye Problem      HISTORY OF PRESENT ILLNESS: Nathan Garner is a 45 y.o. male who presents to the clinic today for:   HPI   Pt states "I am seeing thick black lines and dots that won't disappear." I pain described as a 7/10. "Every once in a while it is like a stabbing but mainly pressure. It started yesterday out of nowhere, it woke me up out of my sleep around 7 in the morning. The lines in my vision are constant, the pain is constant like the pressure is there but if I look at a phone it is like a stabbing pain. Light sensitive a little bit." Pt is Diabetic, Type 2, pt states last A1C 7.9, LBS was 195 last night, pt has not checked today, he just got off work.   Patient is seen today at an response to phone call last night while on-call Last edited by Edmon Crape, MD on 09/12/2021 10:06 AM.      Referring physician: Shon Millet, MD [address] [place],  [place] [address]  HISTORICAL INFORMATION:   Selected notes from the MEDICAL RECORD NUMBER    Lab Results  Component Value Date   HGBA1C =>14.0 12/13/2015     CURRENT MEDICATIONS: No current outpatient medications on file. (Ophthalmic Drugs)   No current facility-administered medications for this visit. (Ophthalmic Drugs)   Current Outpatient Medications (Other)  Medication Sig   emtricitabine-tenofovir (TRUVADA) 200-300 MG tablet Take 1 tablet by mouth daily.   insulin glargine (LANTUS) 100 UNIT/ML injection Start with 10 units daily, increase as directed.   lidocaine (XYLOCAINE) 5 % ointment Reported on 12/13/2015   metFORMIN (GLUCOPHAGE) 500 MG tablet Take 1 tablet (500 mg total) by mouth 2 (two) times daily with a meal.   NONFORMULARY OR COMPOUNDED ITEM Reported on 12/13/2015   pregabalin (LYRICA) 100 MG capsule Take 1 capsule (100 mg total) by mouth 2 (two) times daily. (Patient not taking: Reported  on 12/01/2015)   raltegravir (ISENTRESS) 400 MG tablet Take 1 tablet (400 mg total) by mouth 2 (two) times daily. (Patient not taking: Reported on 12/13/2015)   No current facility-administered medications for this visit. (Other)      REVIEW OF SYSTEMS:    ALLERGIES Allergies  Allergen Reactions   Bee Venom Anaphylaxis   Lisinopril Anaphylaxis    Other reaction(s): DIFFICULTY SWALLOWING   Promethazine Nausea And Vomiting    Other reaction(s): NAUSEA   Niacin-Lovastatin Er     REACTION: ANYTHING WITH NIACIN    PAST MEDICAL HISTORY Past Medical History:  Diagnosis Date   Diabetes (HCC)    type 2   Hypercholesterolemia    Hypertension    Past Surgical History:  Procedure Laterality Date   abscess removal Bilateral    inner thighs x 7   lap choley  2013    FAMILY HISTORY Family History  Problem Relation Age of Onset   Diabetes Mother    Cancer Maternal Uncle    Diabetes Maternal Grandmother    Hypertension Maternal Grandmother    Cancer Maternal Grandmother     SOCIAL HISTORY Social History   Tobacco Use   Smoking status: Some Days    Packs/day: 0.30    Years: 18.00    Pack years: 5.40    Types: Cigarettes  Substance Use Topics   Alcohol  use: Yes    Alcohol/week: 0.0 standard drinks    Comment: monthly beer   Drug use: No         OPHTHALMIC EXAM:  Base Eye Exam     Visual Acuity (ETDRS)       Right Left   Dist [place] 20/40 -2 20/30 -2   Dist ph [place] 20/25 -2         Tonometry (Tonopen, 8:40 AM)       Right Left   Pressure 15 13         Pupils       Pupils Dark Light React APD   Right PERRL 4 3.5 Minimal None   Left PERRL 4 3 Brisk None         Visual Fields (Counting fingers)       Left Right    Full Full         Extraocular Movement       Right Left    Full Full         Neuro/Psych     Oriented x3: Yes   Mood/Affect: Normal         Dilation     Both eyes: 1.0% Mydriacyl, 2.5% Phenylephrine @ 8:40 AM            Slit Lamp and Fundus Exam     External Exam       Right Left   External Normal Normal         Slit Lamp Exam       Right Left   Lids/Lashes Normal Normal   Conjunctiva/Sclera White and quiet White and quiet   Cornea Clear Clear   Anterior Chamber Deep and quiet Deep and quiet   Iris Round and reactive Round and reactive   Lens Centered posterior chamber intraocular lens Centered posterior chamber intraocular lens   Anterior Vitreous Normal Normal         Fundus Exam       Right Left   Posterior Vitreous Pre-retinal hemorrhage, Vitreous hemorrhage Normal, clear   Disc Normal Normal   C/D Ratio 0.5 0.5   Macula Microaneurysms Microaneurysms   Vessels Normal Normal   Periphery Good PRP, with active NVI superior, room for more laser peripherally Good PRP OS,            IMAGING AND PROCEDURES  Imaging and Procedures for 09/12/21  OCT, Retina - OU - Both Eyes       Right Eye Quality was good. Scan locations included subfoveal. Central Foveal Thickness: 277. Findings include abnormal foveal contour.   Left Eye Quality was good. Scan locations included subfoveal. Central Foveal Thickness: 284. Findings include abnormal foveal contour.   Notes Non center involved CSME, observe OU  OD with vit debris, vit heme.  OS , clear vit     Color Fundus Photography Optos - OU - Both Eyes       Right Eye Progression has no prior data. Disc findings include normal observations, neovascularization. Macula : microaneurysms.   Left Eye Progression has no prior data. Disc findings include normal observations.   Notes OD with PDR active with NVE and NVD present in the posterior pole with preretinal and vitreous hemorrhage.  Region of retinal nonperfusion, with white lines inferotemporal macula.  We will need likely vitrectomy to resect a vascular disease as well as deliver completion of PRP.  OS with clear media, good PRP peripherally.  May have minor  exudates in the  posterior pole signifying minor CSME yet no impact on acuity. Will likely be able to observe OS             ASSESSMENT/PLAN:  Vitreous hemorrhage, right eye (HCC) OD associated with active PDR in the posterior pole with neovascularization in an non- vitrectomized eye.  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  And does not therefore need any further therapy.   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 I explained to the patient that laser treatment is required regions of prior nontreatment in order to quiet down the entire eye however he should know that the current bleeding will not subside quickly.  We are trying to prevent new bleeding from developing.  If the current bleeding spots persist or worsen surgical intervention by retina surgeon could be undertaken to clear this out as well as very remove all scar tissue from the active blood vessels growing  This part of note dictated in presence of patient using as many non medical terms as possible for his understanding.     ICD-10-CM   1. 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 Vitreous hemorrhage, right eye (HCC)  H43.11       1.  OD, I will recommend a for step in the right eye  to be adding laser in the areas anterior to the retinal nonperfusion as well as limited in the posterior pole near a large area of retinal nonperfusion to decrease current blood vessels growing  Thereafter if the blood clears no further intervention is required, if it does not clear or if it does worsen in any way surgical intervention may be undertaken an outpatient basis to remove this vitreous hemorrhage  2.  OS requires observation alone  3.  Ophthalmic Meds Ordered this visit:  No orders of the defined types were placed in this encounter.      Return in about 2 days (around 09/14/2021) for dilate, OD, PRP.  There are no Patient Instructions on file for this visit.   Explained the diagnoses, plan, and follow up with the patient and they expressed understanding.  Patient expressed understanding of the importance of proper follow up care.   Alford Highland Thao Bauza M.D. Diseases & Surgery of the Retina and Vitreous [facility] 09/12/21     Abbreviations: M myopia (nearsighted); A astigmatism; H hyperopia (farsighted); P presbyopia; Mrx spectacle prescription;  CTL contact lenses; OD right eye; OS left eye; OU both eyes  XT exotropia; ET esotropia; PEK punctate epithelial keratitis; PEE punctate epithelial erosions; DES dry eye syndrome; MGD meibomian gland dysfunction; ATs artificial tears; PFAT's preservative free artificial tears; NSC nuclear sclerotic cataract; PSC posterior subcapsular cataract; ERM epi-retinal membrane; PVD posterior vitreous detachment; RD retinal detachment; DM diabetes mellitus;  DR diabetic retinopathy; NPDR non-proliferative diabetic retinopathy; PDR proliferative diabetic retinopathy; CSME clinically significant macular edema; DME diabetic macular edema; dbh dot blot hemorrhages; CWS cotton wool spot; POAG primary open angle glaucoma; C/D cup-to-disc ratio; HVF humphrey visual field; GVF goldmann visual field; OCT optical coherence tomography; IOP  intraocular pressure; BRVO Branch retinal vein occlusion; CRVO central retinal vein occlusion; CRAO central retinal artery occlusion; BRAO branch retinal artery occlusion; RT retinal tear; SB scleral buckle; PPV pars plana vitrectomy; VH Vitreous hemorrhage; PRP panretinal laser photocoagulation; IVK intravitreal kenalog; VMT vitreomacular traction; MH Macular hole;  NVD neovascularization of the disc; NVE neovascularization elsewhere; AREDS age related eye disease study; ARMD age related macular degeneration; POAG primary open angle glaucoma; EBMD epithelial/anterior basement membrane dystrophy; ACIOL anterior chamber intraocular lens; IOL intraocular lens; PCIOL posterior chamber intraocular lens; Phaco/IOL phacoemulsification with intraocular lens placement; PRK photorefractive keratectomy; LASIK laser assisted in situ keratomileusis; HTN hypertension; DM diabetes mellitus; COPD chronic obstructive pulmonary disease

## 2021-09-14 ENCOUNTER — Ambulatory Visit (INDEPENDENT_AMBULATORY_CARE_PROVIDER_SITE_OTHER): Payer: BC Managed Care – PPO | Admitting: Ophthalmology

## 2021-09-14 ENCOUNTER — Encounter (INDEPENDENT_AMBULATORY_CARE_PROVIDER_SITE_OTHER): Payer: Self-pay

## 2021-09-14 ENCOUNTER — Encounter (INDEPENDENT_AMBULATORY_CARE_PROVIDER_SITE_OTHER): Payer: Self-pay | Admitting: Ophthalmology

## 2021-09-14 ENCOUNTER — Other Ambulatory Visit: Payer: Self-pay

## 2021-09-14 DIAGNOSIS — E113591 Type 2 diabetes mellitus with proliferative diabetic retinopathy without macular edema, right eye: Secondary | ICD-10-CM | POA: Diagnosis not present

## 2021-09-14 NOTE — Progress Notes (Signed)
09/14/2021     CHIEF COMPLAINT Patient presents for  Chief Complaint  Patient presents with   Retina Follow Up      HISTORY OF PRESENT ILLNESS: Nathan Garner is a 45 y.o. male who presents to the clinic today for:   HPI     Retina Follow Up   Patient presents with  Other.  In right eye.  This started 2 days ago.  Duration of 2 days.        Comments   2 day PRP OD;  no interval change in floaters OD  Pt c/o that the floaters in the right eye have increased in the number as well as the size of the floaters. Pt c/o having eye pain in the right eye, intermittent, describes pain as dull ache, as well as having a sharp pain from the nasal brow region to the inner canthus area of the right eye-occurs only when having to read for any amount of time.   Type 2 Diabetic. Diagnosed since 2007 A1c: 7.9  Eye Meds: None      Last edited by Edmon Crape, MD on 09/14/2021  8:54 AM.      Referring physician: Agustina Caroli, MD [address] [place],  [place] [address]  HISTORICAL INFORMATION:   Selected notes from the MEDICAL RECORD NUMBER    Lab Results  Component Value Date   HGBA1C =>14.0 12/13/2015     CURRENT MEDICATIONS: No current outpatient medications on file. (Ophthalmic Drugs)   No current facility-administered medications for this visit. (Ophthalmic Drugs)   Current Outpatient Medications (Other)  Medication Sig   emtricitabine-tenofovir (TRUVADA) 200-300 MG tablet Take 1 tablet by mouth daily.   insulin glargine (LANTUS) 100 UNIT/ML injection Start with 10 units daily, increase as directed.   lidocaine (XYLOCAINE) 5 % ointment Reported on 12/13/2015   metFORMIN (GLUCOPHAGE) 500 MG tablet Take 1 tablet (500 mg total) by mouth 2 (two) times daily with a meal.   NONFORMULARY OR COMPOUNDED ITEM Reported on 12/13/2015   pregabalin (LYRICA) 100 MG capsule Take 1 capsule (100 mg total) by mouth 2 (two) times daily. (Patient not taking: Reported on 12/01/2015)    raltegravir (ISENTRESS) 400 MG tablet Take 1 tablet (400 mg total) by mouth 2 (two) times daily. (Patient not taking: Reported on 12/13/2015)   No current facility-administered medications for this visit. (Other)      REVIEW OF SYSTEMS:    ALLERGIES Allergies  Allergen Reactions   Bee Venom Anaphylaxis   Lisinopril Anaphylaxis    Other reaction(s): DIFFICULTY SWALLOWING   Promethazine Nausea And Vomiting    Other reaction(s): NAUSEA   Niacin-Lovastatin Er     REACTION: ANYTHING WITH NIACIN    PAST MEDICAL HISTORY Past Medical History:  Diagnosis Date   Diabetes (HCC)    type 2   Hypercholesterolemia    Hypertension    Past Surgical History:  Procedure Laterality Date   abscess removal Bilateral    inner thighs x 7   lap choley  2013    FAMILY HISTORY Family History  Problem Relation Age of Onset   Diabetes Mother    Cancer Maternal Uncle    Diabetes Maternal Grandmother    Hypertension Maternal Grandmother    Cancer Maternal Grandmother     SOCIAL HISTORY Social History   Tobacco Use   Smoking status: Some Days    Packs/day: 0.30    Years: 18.00    Pack years: 5.40  Types: Cigarettes   Smokeless tobacco: Never  Substance Use Topics   Alcohol use: Yes    Alcohol/week: 0.0 standard drinks    Comment: monthly beer   Drug use: No         OPHTHALMIC EXAM:  Base Eye Exam     Visual Acuity (ETDRS)       Right Left   Dist [place] 20/25 -2 20/20 -1         Tonometry (Tonopen, 8:18 AM)       Right Left   Pressure 9 9         Pupils       Pupils Dark Light Shape React APD   Right PERRL 4 3.5 Round Brisk None   Left PERRL 4 3 Round Brisk None         Visual Fields (Counting fingers)       Left Right    Full Full         Extraocular Movement       Right Left    Full, Ortho Full, Ortho         Neuro/Psych     Oriented x3: Yes   Mood/Affect: Normal         Dilation     Right eye: 1.0% Mydriacyl, 2.5%  Phenylephrine @ 8:18 AM           Slit Lamp and Fundus Exam     External Exam       Right Left   External Normal Normal         Slit Lamp Exam       Right Left   Lids/Lashes Normal Normal   Conjunctiva/Sclera White and quiet White and quiet   Cornea Clear Clear   Anterior Chamber Deep and quiet Deep and quiet   Iris Round and reactive Round and reactive   Lens Centered posterior chamber intraocular lens Centered posterior chamber intraocular lens   Anterior Vitreous Normal Normal         Fundus Exam       Right Left   Posterior Vitreous Pre-retinal hemorrhage, Vitreous hemorrhage    Disc Normal    C/D Ratio 0.5    Macula Microaneurysms    Vessels Normal    Periphery Good PRP, with active NVI superior, room for more laser peripherally and region posteriorly of white lines of nonperfusion temporal portion of the macula.             IMAGING AND PROCEDURES  Imaging and Procedures for 09/14/21  Panretinal Photocoagulation - OD - Right Eye       Time Out Confirmed correct patient, procedure, site, and patient consented.   Anesthesia Topical anesthesia was used. Anesthetic medications included Proparacaine 0.5%.   Laser Information The type of laser was diode. Color was yellow. The duration in seconds was 0.03. The spot size was 390 microns. Laser power was 230. Total spots was 201.   Post-op The patient tolerated the procedure well. There were no complications. The patient received written and verbal post procedure care education.   Notes Nonperfused areas posteriorly and             ASSESSMENT/PLAN:  No problem-specific Assessment & Plan notes found for this encounter.      ICD-10-CM   1. 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  PRP completed  today in the posterior segment in the regions of retinal  nonperfusion peripherally.  2.  I explained to the patient that should the vitreous hemorrhage continue to worsen and hamper his activities of daily living he can call well before his next scheduled appointment  3.  Reviewed with the patient the benefits of continuous glucose monitoring and asked him to check with medical doctors so that he can as a motivated diabetes patient obtained a finest moment to moment of evaluation and potential for management of blood sugars  4.  And understands that hemorrhage does not cause blindness at today's laser is an attempt to halt neovascular growth.  Ophthalmic Meds Ordered this visit:  No orders of the defined types were placed in this encounter.      Return in about 6 weeks (around 10/26/2021) for DILATE OU, COLOR FP, OD.  There are no Patient Instructions on file for this visit.   Explained the diagnoses, plan, and follow up with the patient and they expressed understanding.  Patient expressed understanding of the importance of proper follow up care.   Alford Highland Torsha Lemus M.D. Diseases & Surgery of the Retina and Vitreous [facility] 09/14/21     Abbreviations: M myopia (nearsighted); A astigmatism; H hyperopia (farsighted); P presbyopia; Mrx spectacle prescription;  CTL contact lenses; OD right eye; OS left eye; OU both eyes  XT exotropia; ET esotropia; PEK punctate epithelial keratitis; PEE punctate epithelial erosions; DES dry eye syndrome; MGD meibomian gland dysfunction; ATs artificial tears; PFAT's preservative free artificial tears; NSC nuclear sclerotic cataract; PSC posterior subcapsular cataract; ERM epi-retinal membrane; PVD posterior vitreous detachment; RD retinal detachment; DM diabetes mellitus; DR diabetic retinopathy; NPDR non-proliferative diabetic retinopathy; PDR proliferative diabetic retinopathy; CSME clinically significant macular edema; DME diabetic macular edema; dbh dot blot hemorrhages; CWS cotton wool spot;  POAG primary open angle glaucoma; C/D cup-to-disc ratio; HVF humphrey visual field; GVF goldmann visual field; OCT optical coherence tomography; IOP intraocular pressure; BRVO Branch retinal vein occlusion; CRVO central retinal vein occlusion; CRAO central retinal artery occlusion; BRAO branch retinal artery occlusion; RT retinal tear; SB scleral buckle; PPV pars plana vitrectomy; VH Vitreous hemorrhage; PRP panretinal laser photocoagulation; IVK intravitreal kenalog; VMT vitreomacular traction; MH Macular hole;  NVD neovascularization of the disc; NVE neovascularization elsewhere; AREDS age related eye disease study; ARMD age related macular degeneration; POAG primary open angle glaucoma; EBMD epithelial/anterior basement membrane dystrophy; ACIOL anterior chamber intraocular lens; IOL intraocular lens; PCIOL posterior chamber intraocular lens; Phaco/IOL phacoemulsification with intraocular lens placement; PRK photorefractive keratectomy; LASIK laser assisted in situ keratomileusis; HTN hypertension; DM diabetes mellitus; COPD chronic obstructive pulmonary disease

## 2021-10-30 ENCOUNTER — Encounter (INDEPENDENT_AMBULATORY_CARE_PROVIDER_SITE_OTHER): Payer: BC Managed Care – PPO | Admitting: Ophthalmology

## 2022-02-13 ENCOUNTER — Observation Stay: Payer: Self-pay

## 2022-02-13 ENCOUNTER — Encounter: Payer: Self-pay | Admitting: *Deleted

## 2022-02-13 ENCOUNTER — Other Ambulatory Visit: Payer: Self-pay | Admitting: Radiology

## 2022-02-13 ENCOUNTER — Observation Stay
Admission: EM | Admit: 2022-02-13 | Discharge: 2022-02-14 | Disposition: A | Discharge status: Home / Self Care | Payer: Self-pay | Attending: Internal Medicine | Admitting: Internal Medicine

## 2022-02-13 ENCOUNTER — Emergency Department: Payer: Self-pay

## 2022-02-13 ENCOUNTER — Other Ambulatory Visit: Payer: Self-pay

## 2022-02-13 DIAGNOSIS — R0789 Other chest pain: Secondary | ICD-10-CM

## 2022-02-13 DIAGNOSIS — R079 Chest pain, unspecified: Secondary | ICD-10-CM | POA: Diagnosis present

## 2022-02-13 DIAGNOSIS — Z794 Long term (current) use of insulin: Secondary | ICD-10-CM | POA: Insufficient documentation

## 2022-02-13 DIAGNOSIS — Z79899 Other long term (current) drug therapy: Secondary | ICD-10-CM | POA: Insufficient documentation

## 2022-02-13 DIAGNOSIS — I10 Essential (primary) hypertension: Secondary | ICD-10-CM | POA: Insufficient documentation

## 2022-02-13 DIAGNOSIS — Z20822 Contact with and (suspected) exposure to covid-19: Secondary | ICD-10-CM | POA: Insufficient documentation

## 2022-02-13 DIAGNOSIS — I2510 Atherosclerotic heart disease of native coronary artery without angina pectoris: Secondary | ICD-10-CM | POA: Diagnosis present

## 2022-02-13 DIAGNOSIS — Z720 Tobacco use: Secondary | ICD-10-CM | POA: Diagnosis present

## 2022-02-13 DIAGNOSIS — Z7982 Long term (current) use of aspirin: Secondary | ICD-10-CM | POA: Insufficient documentation

## 2022-02-13 DIAGNOSIS — E1165 Type 2 diabetes mellitus with hyperglycemia: Secondary | ICD-10-CM | POA: Insufficient documentation

## 2022-02-13 DIAGNOSIS — E113552 Type 2 diabetes mellitus with stable proliferative diabetic retinopathy, left eye: Secondary | ICD-10-CM | POA: Insufficient documentation

## 2022-02-13 DIAGNOSIS — J209 Acute bronchitis, unspecified: Principal | ICD-10-CM | POA: Insufficient documentation

## 2022-02-13 DIAGNOSIS — Z7984 Long term (current) use of oral hypoglycemic drugs: Secondary | ICD-10-CM | POA: Insufficient documentation

## 2022-02-13 DIAGNOSIS — N492 Inflammatory disorders of scrotum: Secondary | ICD-10-CM | POA: Insufficient documentation

## 2022-02-13 DIAGNOSIS — F32A Depression, unspecified: Secondary | ICD-10-CM | POA: Diagnosis present

## 2022-02-13 DIAGNOSIS — E785 Hyperlipidemia, unspecified: Secondary | ICD-10-CM | POA: Diagnosis present

## 2022-02-13 LAB — URINE DRUG SCREEN, QUALITATIVE (ARMC ONLY)
Amphetamines, Ur Screen: NOT DETECTED
Barbiturates, Ur Screen: NOT DETECTED
Benzodiazepine, Ur Scrn: NOT DETECTED
Cannabinoid 50 Ng, Ur ~~LOC~~: NOT DETECTED
Cocaine Metabolite,Ur ~~LOC~~: NOT DETECTED
MDMA (Ecstasy)Ur Screen: NOT DETECTED
Methadone Scn, Ur: NOT DETECTED
Opiate, Ur Screen: NOT DETECTED
Phencyclidine (PCP) Ur S: NOT DETECTED
Tricyclic, Ur Screen: NOT DETECTED

## 2022-02-13 LAB — CBC
HCT: 39.9 % (ref 39.0–52.0)
HCT: 39.8 % (ref 39.0–52.0)
Hemoglobin: 13 g/dL (ref 13.0–17.0)
Hemoglobin: 12.9 g/dL — ABNORMAL LOW (ref 13.0–17.0)
MCH: 27.6 pg (ref 26.0–34.0)
MCH: 27.6 pg (ref 26.0–34.0)
MCHC: 32.6 g/dL (ref 30.0–36.0)
MCHC: 32.4 g/dL (ref 30.0–36.0)
MCV: 84.7 fL (ref 80.0–100.0)
MCV: 85.2 fL (ref 80.0–100.0)
Platelets: 359 10*3/uL (ref 150–400)
Platelets: 356 10*3/uL (ref 150–400)
RBC: 4.71 MIL/uL (ref 4.22–5.81)
RBC: 4.67 MIL/uL (ref 4.22–5.81)
RDW: 14.6 % (ref 11.5–15.5)
RDW: 14.8 % (ref 11.5–15.5)
WBC: 10.4 10*3/uL (ref 4.0–10.5)
WBC: 8.9 10*3/uL (ref 4.0–10.5)
nRBC: 0 % (ref 0.0–0.2)
nRBC: 0 % (ref 0.0–0.2)

## 2022-02-13 LAB — BASIC METABOLIC PANEL
Anion gap: 7 (ref 5–15)
BUN: 16 mg/dL (ref 6–20)
CO2: 25 mmol/L (ref 22–32)
Calcium: 8.8 mg/dL — ABNORMAL LOW (ref 8.9–10.3)
Chloride: 104 mmol/L (ref 98–111)
Creatinine, Ser: 0.74 mg/dL (ref 0.61–1.24)
GFR, Estimated: >60 mL/min (ref >60)
Glucose, Bld: 204 mg/dL — ABNORMAL HIGH (ref 70–99)
Potassium: 3.7 mmol/L (ref 3.5–5.1)
Sodium: 136 mmol/L (ref 135–145)

## 2022-02-13 LAB — GLUCOSE, CAPILLARY
Glucose-Capillary: 274 mg/dL — ABNORMAL HIGH (ref 70–99)
Glucose-Capillary: 281 mg/dL — ABNORMAL HIGH (ref 70–99)
Glucose-Capillary: 319 mg/dL — ABNORMAL HIGH (ref 70–99)

## 2022-02-13 LAB — CREATININE, SERUM
Creatinine, Ser: 0.66 mg/dL (ref 0.61–1.24)
GFR, Estimated: >60 mL/min (ref >60)

## 2022-02-13 LAB — TROPONIN I (HIGH SENSITIVITY)
Troponin I (High Sensitivity): 13 ng/L (ref <18)
Troponin I (High Sensitivity): 12 ng/L (ref <18)
Troponin I (High Sensitivity): 12 ng/L (ref <18)

## 2022-02-13 LAB — PROTIME-INR
INR: 0.9 (ref 0.8–1.2)
Prothrombin Time: 12.5 seconds (ref 11.4–15.2)

## 2022-02-13 LAB — CBG MONITORING, ED
Glucose-Capillary: 176 mg/dL — ABNORMAL HIGH (ref 70–99)
Glucose-Capillary: 221 mg/dL — ABNORMAL HIGH (ref 70–99)

## 2022-02-13 LAB — RESP PANEL BY RT-PCR (FLU A&B, COVID) ARPGX2
Influenza A by PCR: NEGATIVE
Influenza B by PCR: NEGATIVE
SARS Coronavirus 2 by RT PCR: NEGATIVE

## 2022-02-13 LAB — APTT: aPTT: 31 seconds (ref 24–36)

## 2022-02-13 LAB — HEPARIN LEVEL (UNFRACTIONATED): Heparin Unfractionated: <0.1 IU/mL — ABNORMAL LOW (ref 0.30–0.70)

## 2022-02-13 LAB — D-DIMER, QUANTITATIVE: D-Dimer, Quant: <0.27 ug/mL-FEU (ref 0.00–0.50)

## 2022-02-13 LAB — HEMOGLOBIN A1C
Hgb A1c MFr Bld: 10.6 % — ABNORMAL HIGH (ref 4.8–5.6)
Mean Plasma Glucose: 258 mg/dL

## 2022-02-13 LAB — MAGNESIUM: Magnesium: 1.6 mg/dL — ABNORMAL LOW (ref 1.7–2.4)

## 2022-02-13 MED ORDER — METOPROLOL SUCCINATE ER 25 MG PO TB24
25.0000 mg | ORAL_TABLET | Freq: Every day | ORAL | Status: DC
  Administered 2022-02-13 – 2022-02-14 (×2): 25 mg via ORAL
  Filled 2022-02-13 (×2): qty 1

## 2022-02-13 MED ORDER — FENTANYL CITRATE PF 50 MCG/ML IJ SOSY
50.0000 ug | PREFILLED_SYRINGE | Freq: Once | INTRAMUSCULAR | Status: AC
  Administered 2022-02-13: 50 ug via INTRAVENOUS
  Filled 2022-02-13: qty 1

## 2022-02-13 MED ORDER — LOSARTAN POTASSIUM 25 MG PO TABS
25.0000 mg | ORAL_TABLET | Freq: Every day | ORAL | Status: DC
  Administered 2022-02-13 – 2022-02-14 (×2): 25 mg via ORAL
  Filled 2022-02-13 (×2): qty 1

## 2022-02-13 MED ORDER — FENTANYL CITRATE PF 50 MCG/ML IJ SOSY
50.0000 ug | PREFILLED_SYRINGE | Freq: Once | INTRAMUSCULAR | Status: AC
Start: 2022-02-13 — End: 2022-02-13
  Administered 2022-02-13: 50 ug via INTRAVENOUS
  Filled 2022-02-13: qty 1

## 2022-02-13 MED ORDER — NITROGLYCERIN 0.4 MG SL SUBL
0.4000 mg | SUBLINGUAL_TABLET | SUBLINGUAL | Status: DC | PRN
Start: 2022-02-13 — End: 2022-02-14

## 2022-02-13 MED ORDER — HEPARIN (PORCINE) 25000 UT/250ML-% IV SOLN
1400.0000 [IU]/h | INTRAVENOUS | Status: DC
  Administered 2022-02-13: 1400 [IU]/h via INTRAVENOUS
  Filled 2022-02-13: qty 250

## 2022-02-13 MED ORDER — INSULIN ASPART 100 UNIT/ML IJ SOLN
0.0000 [IU] | Freq: Three times a day (TID) | INTRAMUSCULAR | Status: DC
  Administered 2022-02-13: 5 [IU] via SUBCUTANEOUS
  Administered 2022-02-13: 3 [IU] via SUBCUTANEOUS
  Administered 2022-02-13 – 2022-02-14 (×3): 2 [IU] via SUBCUTANEOUS
  Filled 2022-02-13 (×5): qty 1

## 2022-02-13 MED ORDER — METHYLPREDNISOLONE SODIUM SUCC 125 MG IJ SOLR
80.0000 mg | Freq: Every day | INTRAMUSCULAR | Status: DC
  Administered 2022-02-13 – 2022-02-14 (×2): 80 mg via INTRAVENOUS
  Filled 2022-02-13 (×2): qty 2

## 2022-02-13 MED ORDER — SODIUM CHLORIDE 0.9 % IV SOLN: INTRAVENOUS | Status: DC

## 2022-02-13 MED ORDER — HEPARIN SODIUM (PORCINE) 5000 UNIT/ML IJ SOLN
5000.0000 [IU] | Freq: Three times a day (TID) | INTRAMUSCULAR | Status: DC
  Administered 2022-02-13 – 2022-02-14 (×3): 5000 [IU] via SUBCUTANEOUS
  Filled 2022-02-13 (×4): qty 1

## 2022-02-13 MED ORDER — AZITHROMYCIN 500 MG PO TABS
500.0000 mg | ORAL_TABLET | Freq: Every day | ORAL | Status: AC
  Administered 2022-02-13: 500 mg via ORAL
  Filled 2022-02-13: qty 1

## 2022-02-13 MED ORDER — FAMOTIDINE 20 MG PO TABS
20.0000 mg | ORAL_TABLET | Freq: Every day | ORAL | Status: DC
  Administered 2022-02-13 – 2022-02-14 (×2): 20 mg via ORAL
  Filled 2022-02-13 (×2): qty 1

## 2022-02-13 MED ORDER — ALBUTEROL SULFATE (2.5 MG/3ML) 0.083% IN NEBU: 2.5000 mg | INHALATION_SOLUTION | RESPIRATORY_TRACT | Status: DC | PRN

## 2022-02-13 MED ORDER — DM-GUAIFENESIN ER 30-600 MG PO TB12
1.0000 | ORAL_TABLET | Freq: Two times a day (BID) | ORAL | Status: DC | PRN
  Administered 2022-02-13: 1 via ORAL
  Filled 2022-02-13: qty 1

## 2022-02-13 MED ORDER — DULOXETINE HCL 30 MG PO CPEP
60.0000 mg | ORAL_CAPSULE | Freq: Every day | ORAL | Status: DC
  Administered 2022-02-13 – 2022-02-14 (×2): 60 mg via ORAL
  Filled 2022-02-13: qty 2
  Filled 2022-02-13: qty 1

## 2022-02-13 MED ORDER — AZITHROMYCIN 250 MG PO TABS
250.0000 mg | ORAL_TABLET | Freq: Every day | ORAL | Status: DC
  Administered 2022-02-14: 250 mg via ORAL
  Filled 2022-02-13: qty 1

## 2022-02-13 MED ORDER — ATORVASTATIN CALCIUM 20 MG PO TABS
40.0000 mg | ORAL_TABLET | Freq: Every day | ORAL | Status: DC
  Administered 2022-02-13: 40 mg via ORAL
  Filled 2022-02-13: qty 2

## 2022-02-13 MED ORDER — AMLODIPINE BESYLATE 5 MG PO TABS
5.0000 mg | ORAL_TABLET | Freq: Every day | ORAL | Status: DC
  Administered 2022-02-13: 5 mg via ORAL
  Filled 2022-02-13: qty 1

## 2022-02-13 MED ORDER — FENTANYL CITRATE PF 50 MCG/ML IJ SOSY
25.0000 ug | PREFILLED_SYRINGE | INTRAMUSCULAR | Status: DC | PRN
  Administered 2022-02-13 – 2022-02-14 (×4): 25 ug via INTRAVENOUS
  Filled 2022-02-13 (×5): qty 1

## 2022-02-13 MED ORDER — ASPIRIN EC 81 MG PO TBEC
81.0000 mg | DELAYED_RELEASE_TABLET | Freq: Every day | ORAL | Status: DC
  Administered 2022-02-14: 81 mg via ORAL
  Filled 2022-02-13: qty 1

## 2022-02-13 MED ORDER — GABAPENTIN 300 MG PO CAPS
300.0000 mg | ORAL_CAPSULE | Freq: Two times a day (BID) | ORAL | Status: DC | PRN
  Administered 2022-02-13: 300 mg via ORAL
  Filled 2022-02-13: qty 1

## 2022-02-13 MED ORDER — INSULIN ASPART 100 UNIT/ML IJ SOLN
0.0000 [IU] | Freq: Every day | INTRAMUSCULAR | Status: DC
  Administered 2022-02-13: 4 [IU] via SUBCUTANEOUS
  Filled 2022-02-13: qty 1

## 2022-02-13 MED ORDER — HEPARIN BOLUS VIA INFUSION
4000.0000 [IU] | Freq: Once | INTRAVENOUS | Status: AC
  Administered 2022-02-13: 4000 [IU] via INTRAVENOUS
  Filled 2022-02-13: qty 4000

## 2022-02-13 MED ORDER — NALOXONE HCL 2 MG/2ML IJ SOSY
0.4000 mg | PREFILLED_SYRINGE | INTRAMUSCULAR | Status: DC | PRN
  Filled 2022-02-13: qty 2

## 2022-02-13 MED ORDER — IPRATROPIUM-ALBUTEROL 0.5-2.5 (3) MG/3ML IN SOLN
3.0000 mL | Freq: Four times a day (QID) | RESPIRATORY_TRACT | Status: DC
  Administered 2022-02-13 – 2022-02-14 (×5): 3 mL via RESPIRATORY_TRACT
  Filled 2022-02-13 (×5): qty 3

## 2022-02-13 MED ORDER — NICOTINE 21 MG/24HR TD PT24
21.0000 mg | MEDICATED_PATCH | Freq: Every day | TRANSDERMAL | Status: DC
Start: 2022-02-13 — End: 2022-02-14
  Filled 2022-02-13: qty 1

## 2022-02-13 MED ORDER — HEPARIN SODIUM (PORCINE) 5000 UNIT/ML IJ SOLN: 5000.0000 [IU] | Freq: Three times a day (TID) | INTRAMUSCULAR | Status: DC

## 2022-02-13 MED ORDER — FENTANYL CITRATE PF 50 MCG/ML IJ SOSY: 50.0000 ug | PREFILLED_SYRINGE | INTRAMUSCULAR | Status: DC | PRN

## 2022-02-13 MED ORDER — ONDANSETRON HCL 4 MG/2ML IJ SOLN: 4.0000 mg | Freq: Three times a day (TID) | INTRAMUSCULAR | Status: DC | PRN

## 2022-02-13 MED ORDER — IPRATROPIUM-ALBUTEROL 0.5-2.5 (3) MG/3ML IN SOLN: 3.0000 mL | RESPIRATORY_TRACT | Status: DC

## 2022-02-13 MED ORDER — HYDRALAZINE HCL 20 MG/ML IJ SOLN
5.0000 mg | INTRAMUSCULAR | Status: DC | PRN
  Administered 2022-02-13: 5 mg via INTRAVENOUS
  Filled 2022-02-13: qty 1

## 2022-02-13 MED ORDER — INSULIN GLARGINE-YFGN 100 UNIT/ML ~~LOC~~ SOLN
40.0000 [IU] | Freq: Every day | SUBCUTANEOUS | Status: DC
  Administered 2022-02-13: 40 [IU] via SUBCUTANEOUS
  Filled 2022-02-13 (×2): qty 0.4

## 2022-02-13 MED ORDER — PANTOPRAZOLE SODIUM 40 MG PO TBEC
40.0000 mg | DELAYED_RELEASE_TABLET | Freq: Every day | ORAL | Status: DC
Start: 2022-02-13 — End: 2022-02-14
  Administered 2022-02-13 – 2022-02-14 (×2): 40 mg via ORAL
  Filled 2022-02-13 (×2): qty 1

## 2022-02-13 MED ORDER — ASPIRIN 81 MG PO CHEW
324.0000 mg | CHEWABLE_TABLET | Freq: Once | ORAL | Status: AC
  Administered 2022-02-13: 324 mg via ORAL
  Filled 2022-02-13: qty 4

## 2022-02-13 MED ORDER — ACETAMINOPHEN 650 MG RE SUPP: 650.0000 mg | Freq: Four times a day (QID) | RECTAL | Status: DC | PRN

## 2022-02-13 MED ORDER — MAGNESIUM SULFATE 2 GM/50ML IV SOLN
2.0000 g | Freq: Once | INTRAVENOUS | Status: AC
Start: 2022-02-13 — End: 2022-02-13
  Administered 2022-02-13: 2 g via INTRAVENOUS
  Filled 2022-02-13: qty 50

## 2022-02-13 MED ORDER — ACETAMINOPHEN 325 MG PO TABS
650.0000 mg | ORAL_TABLET | Freq: Four times a day (QID) | ORAL | Status: DC | PRN
  Administered 2022-02-13: 650 mg via ORAL
  Filled 2022-02-13: qty 2

## 2022-02-13 NOTE — Progress Notes (Signed)
PHARMACIST - PHYSICIAN COMMUNICATION   CONCERNING: Methylprednisolone IV    Current order: Methylprednisolone IV 40 mg BID     DESCRIPTION: Per [place] Protocol:   IV methylprednisolone will be converted to either a q12h or q24h frequency with the same total daily dose (TDD).  Ordered Dose: 1 to 125 mg TDD; convert to: TDD q24h.  Ordered Dose: 126 to 250 mg TDD; convert to: TDD div q12h.  Ordered Dose: >250 mg TDD; DAW.  Order has been adjusted to: Methylprednisolone IV 80 mg daily  Benita Gutter 02/13/2022 12:23 PM

## 2022-02-13 NOTE — ED Notes (Signed)
This RN called lab at this time and confirmed they can run CBC and Creatinine off of tubes sent down.

## 2022-02-13 NOTE — H&P (Signed)
History and Physical    Nathan Garner [id number] DOB: 1976-02-17 DOA: 02/13/2022  Referring MD/NP/PA:   PCP: Reeves Dam, MD   Patient coming from:  The patient is coming from home.  At baseline, pt is independent for most of ADL.        Chief Complaint: chest pain  HPI: Nathan Garner is a 46 y.o. male with medical history significant of CAD, HTH, HLD, DM, depression, CAD, tobacco abuse, obesity with BMI 34.17, who presents with chest pain.  Patient states that his chest pain has been going on for almost 3 days, which is located in the right side of chest, intermittent, sharp, 9 out of 10 in severity, radiating to the neck.  It is pleuritic, aggravated by deep breath.  Associated with shortness of breath and dry cough.  No fever or chills.  Patient has nausea, no vomiting, diarrhea or abdominal pain.  No symptoms of UTI. Of note, pt has known history of coronary artery disease, with left-sided coronary angiography in May 2022 at [facility] reportedly demonstrating 75% stenosis to the left circumflex, for which he did not receive intervention at that time, including no PCI or stent placement.    Coronary angiogram 04/2021: Coronary Angiography 1. Left Main -25% smooth stenosis 2. Left anterior descending artery -large vessel that wraps the apex with 25% stenosis 3. Diagonals -large bifurcating diagonal 1 with 50% smooth stenosis 4. Left Circumflex -less than 25% stenosis in the proximal/mid vessel. The distal circumflex is a small vessel (2 mm in diameter) with diffuse 80% stenosis. 5. Obtuse Marginals -large OM1 with 25% stenosis. Moderate sized small to moderate sized OM 2 with no significant stenosis 6. Right Coronary Artery -30% proximal stenosis, 25% mid stenosis 7. Posterior Descending Artery -no significant stenosis  Data Reviewed and ED Course: pt was found to have troponin level 13, 12, WBC 10.4, INR 0.9, PTT 31, negative D-dimer, negative COVID PCR, lactic acid of 1.6, renal  function okay, temperature normal, blood pressure 153/94, heart rate 107, RR 20, oxygen saturation 94% on room air.  Chest x-ray negative.  Patient is placed on telemetry bed for observation.  Dr. Saunders Revel of cardiology is consulted   EKG: I have personally reviewed.  Sinus rhythm, QTc 438, LAD, ST depression in inferior leads, ST elevation in V1-V2.    Review of Systems:   General: no fevers, chills, no body weight gain, has poor appetite, has fatigue HEENT: no blurry vision, hearing changes or sore throat Respiratory: has dyspnea, coughing, wheezing CV: has chest pain, no palpitations GI: has nausea, no vomiting, abdominal pain, diarrhea, constipation GU: no dysuria, burning on urination, increased urinary frequency, hematuria  Ext: no leg edema Neuro: no unilateral weakness, numbness, or tingling, no vision change or hearing loss Skin: no rash, no skin tear. MSK: No muscle spasm, no deformity, no limitation of range of movement in spin Heme: No easy bruising.  Travel history: No recent long distant travel.   Allergy:  Allergies  Allergen Reactions   Bee Venom Anaphylaxis   Lisinopril Anaphylaxis    Other reaction(s): DIFFICULTY SWALLOWING   Oxycodone Itching   Promethazine Nausea And Vomiting    Other reaction(s): NAUSEA   Niacin-Lovastatin Er     REACTION: ANYTHING WITH NIACIN   Niacin Other (See Comments)    flushing flushing     Past Medical History:  Diagnosis Date   Diabetes ([place])    type 2   Hypercholesterolemia    Hypertension  Past Surgical History:  Procedure Laterality Date   abscess removal Bilateral    inner thighs x 7   lap choley  2013    Social History:  reports that he has been smoking cigarettes. He has a 5.40 pack-year smoking history. He has never used smokeless tobacco. He reports that he does not currently use alcohol. He reports that he does not use drugs.  Family History:  Family History  Problem Relation Age of Onset   Diabetes  Mother    Cancer Maternal Uncle    Diabetes Maternal Grandmother    Hypertension Maternal Grandmother    Cancer Maternal Grandmother      Prior to Admission medications   Medication Sig Start Date End Date Taking? Authorizing Provider  amLODipine (NORVASC) 5 MG tablet Take 5 mg by mouth daily. 11/07/21  Yes [provider]  aspirin 81 MG EC tablet Take by mouth. 05/11/21  Yes [provider]  atorvastatin (LIPITOR) 40 MG tablet Take 40 mg by mouth daily. 11/08/21  Yes [provider]  DULoxetine (CYMBALTA) 60 MG capsule Take 60 mg by mouth daily. 12/07/21  Yes [provider]  esomeprazole (NEXIUM) 20 MG capsule Take 20 mg by mouth daily. 10/25/21  Yes [provider]  famotidine (PEPCID) 40 MG tablet Take by mouth.   Yes [provider]  gabapentin (NEURONTIN) 300 MG capsule Take 300 mg by mouth at bedtime. 11/17/21  Yes [provider]  insulin glargine (LANTUS) 100 UNIT/ML injection Inject 60 Units into the skin at bedtime.   Yes [provider]  insulin lispro (HUMALOG) 100 UNIT/ML injection Inject into the skin 3 (three) times daily with meals. Per sliding scale.   Yes [provider]  losartan (COZAAR) 25 MG tablet Take 25 mg by mouth daily. 10/31/21  Yes [provider]  metFORMIN (GLUCOPHAGE) 1000 MG tablet Take 1,000 mg by mouth 2 (two) times daily with a meal. 09/01/20  Yes [provider]  metoprolol succinate (TOPROL-XL) 25 MG 24 hr tablet Take 25 mg by mouth daily. 11/08/21  Yes [provider]  verapamil (CALAN-SR) 240 MG CR tablet Take 240 mg by mouth daily.   Yes [provider]  DULoxetine (CYMBALTA) 30 MG capsule Take 30 mg by mouth daily. Patient not taking: Reported on 02/13/2022 12/07/21   [provider]  emtricitabine-tenofovir (TRUVADA) 200-300 MG tablet Take 1 tablet by mouth daily. Patient not taking: Reported on 02/13/2022 12/02/15   Mancel Bale, PA-C  lacosamide (VIMPAT) 50 MG TABS tablet Take by mouth. Patient not taking: Reported on 02/13/2022 01/09/22   [provider]  Lacosamide 100 MG TABS Take by mouth. Patient not taking: Reported on 02/13/2022 01/16/22 02/15/22  [provider]  pantoprazole (PROTONIX) 40 MG tablet Take by mouth. Patient not taking: Reported on 02/13/2022 08/01/16   [provider]    Physical Exam: Vitals:   02/13/22 0600 02/13/22 0800 02/13/22 1200 02/13/22 1303  BP: (!) 153/94 (!) 157/80 (!) 150/95 (!) 143/85  Pulse: 96 94 (!) 103 94  Resp: 13 17 (!) 22   Temp:      TempSrc:      SpO2: 94% 100% 99%   Weight:      Height:       General: Not in acute distress HEENT:       Eyes: PERRL, EOMI, no scleral icterus.       ENT: No discharge from the ears and nose, no pharynx injection, no  tonsillar enlargement.        Neck: No JVD, no bruit, no mass felt. Heme: No neck lymph node enlargement. Cardiac: S1/S2, RRR, No murmurs, No gallops or rubs. Respiratory: Has wheezing bilaterally (right side is worse than the left) GI: Soft, nondistended, nontender, no rebound pain, no organomegaly, BS present. GU: No hematuria Ext: No pitting leg edema bilaterally. 1+DP/PT pulse bilaterally. Musculoskeletal: No joint deformities, No joint redness or warmth, no limitation of ROM in spin. Skin: No rashes.  Neuro: Alert, oriented X3, cranial nerves II-XII grossly intact, moves all extremities normally.  Psych: Patient is not psychotic, no suicidal or hemocidal ideation.  Labs on Admission: I have personally reviewed following labs and imaging studies  CBC: Recent Labs  Lab 02/13/22 0242 02/13/22 0802  WBC 10.4 8.9  HGB 13.0 12.9*  HCT 39.9 39.8  MCV 84.7 85.2  PLT 359 [id number]   Basic Metabolic Panel: Recent Labs  Lab 02/13/22 0242 02/13/22 0317 02/13/22 0802  NA 136  --   --   K 3.7  --   --   CL 104  --   --   CO2 25  --   --   GLUCOSE 204*  --   --   BUN 16  --   --    CREATININE 0.74  --  0.66  CALCIUM 8.8*  --   --   MG  --  1.6*  --    GFR: Estimated Creatinine Clearance: 149.3 mL/min (by C-G formula based on SCr of 0.66 mg/dL). Liver Function Tests: No results for input(s): AST, ALT, ALKPHOS, BILITOT, PROT, ALBUMIN in the last 168 hours. No results for input(s): LIPASE, AMYLASE in the last 168 hours. No results for input(s): AMMONIA in the last 168 hours. Coagulation Profile: Recent Labs  Lab 02/13/22 0317  INR 0.9   Cardiac Enzymes: No results for input(s): CKTOTAL, CKMB, CKMBINDEX, TROPONINI in the last 168 hours. BNP (last 3 results) No results for input(s): PROBNP in the last 8760 hours. HbA1C: No results for input(s): HGBA1C in the last 72 hours. CBG: Recent Labs  Lab 02/13/22 0808 02/13/22 1105  GLUCAP 176* 221*   Lipid Profile: No results for input(s): CHOL, HDL, LDLCALC, TRIG, CHOLHDL, LDLDIRECT in the last 72 hours. Thyroid Function Tests: No results for input(s): TSH, T4TOTAL, FREET4, T3FREE, THYROIDAB in the last 72 hours. Anemia Panel: No results for input(s): VITAMINB12, FOLATE, FERRITIN, TIBC, IRON, RETICCTPCT in the last 72 hours. Urine analysis:    Component Value Date/Time   COLORURINE LT. YELLOW 05/24/2009 1713   APPEARANCEUR CLEAR 05/24/2009 1713   LABSPEC 1.020 05/24/2009 1713   PHURINE 6.0 05/24/2009 1713   GLUCOSEU NEGATIVE 05/24/2009 1713   BILIRUBINUR NEGATIVE 05/24/2009 1713   KETONESUR NEGATIVE 05/24/2009 1713   UROBILINOGEN 1.0 05/24/2009 1713   NITRITE NEGATIVE 05/24/2009 1713   LEUKOCYTESUR NEGATIVE 05/24/2009 1713   Sepsis Labs: @LABRCNTIP (procalcitonin:4,lacticidven:4) ) Recent Results (from the past 240 hour(s))  Resp Panel by RT-PCR (Flu A&B, Covid) Nasopharyngeal Swab     Status: None   Collection Time: 02/13/22  3:17 AM   Specimen: Nasopharyngeal Swab; Nasopharyngeal(NP) swabs in vial transport medium  Result Value Ref Range Status   SARS Coronavirus 2 by RT PCR NEGATIVE NEGATIVE  Final    Comment: (NOTE) SARS-CoV-2 target nucleic acids are NOT DETECTED.  The SARS-CoV-2 RNA is generally detectable in upper respiratory specimens during the acute phase of infection. The lowest concentration of SARS-CoV-2 viral copies this assay can detect is 138 copies/mL.  A negative result does not preclude SARS-Cov-2 infection and should not be used as the sole basis for treatment or other patient management decisions. A negative result may occur with  improper specimen collection/handling, submission of specimen other than nasopharyngeal swab, presence of viral mutation(s) within the areas targeted by this assay, and inadequate number of viral copies(<138 copies/mL). A negative result must be combined with clinical observations, patient history, and epidemiological information. The expected result is Negative.  Fact Sheet for Patients:  EntrepreneurPulse.com.au  Fact Sheet for Healthcare Providers:  IncredibleEmployment.be  This test is no t yet approved or cleared by the [place] FDA and  has been authorized for detection and/or diagnosis of SARS-CoV-2 by FDA under an Emergency Use Authorization (EUA). This EUA will remain  in effect (meaning this test can be used) for the duration of the COVID-19 declaration under Section 564(b)(1) of the Act, 21 U.S.C.section 360bbb-3(b)(1), unless the authorization is terminated  or revoked sooner.       Influenza A by PCR NEGATIVE NEGATIVE Final   Influenza B by PCR NEGATIVE NEGATIVE Final    Comment: (NOTE) The Xpert Xpress SARS-CoV-2/FLU/RSV plus assay is intended as an aid in the diagnosis of influenza from Nasopharyngeal swab specimens and should not be used as a sole basis for treatment. Nasal washings and aspirates are unacceptable for Xpert Xpress SARS-CoV-2/FLU/RSV testing.  Fact Sheet for Patients: EntrepreneurPulse.com.au  Fact Sheet for Healthcare  Providers: IncredibleEmployment.be  This test is not yet approved or cleared by the [place] FDA and has been authorized for detection and/or diagnosis of SARS-CoV-2 by FDA under an Emergency Use Authorization (EUA). This EUA will remain in effect (meaning this test can be used) for the duration of the COVID-19 declaration under Section 564(b)(1) of the Act, 21 U.S.C. section 360bbb-3(b)(1), unless the authorization is terminated or revoked.  Performed at [facility], [address], [place], [place] [address]      Radiological Exams on Admission: DG Chest 2 View  Result Date: 02/13/2022 CLINICAL DATA:  Chest pain EXAM: CHEST - 2 VIEW COMPARISON:  Chest radiograph dated 11/17/2021. FINDINGS: The heart size and mediastinal contours are within normal limits. Both lungs are clear. The visualized skeletal structures are unremarkable. IMPRESSION: No active cardiopulmonary disease. Electronically Signed   By: Anner Crete M.D.   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  D-dimer is negative, less likely to have PE.  Dr. Saunders Revel of cardiology is consulted. 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  Patient taking metformin, Humalog and Lantus 60  units daily -Sliding scale insulin -Blood insulin 40 units daily  HTN (hypertension) -IV hydralazine as needed -Amlodipine, Cozaar, metoprolol, -Hold verapamil (second CCB)  HLD (hyperlipidemia) -Lipitor  Tobacco abuse -Nicotine patch  Hypomagnesemia: Magnesium level 1.6 -Repleted magnesium  Depression -Continue home Cymbalta  Acute bronchitis: Patient has cough, shortness breath, wheezing on auscultation, indicating possible acute bronchitis.  Chest x-ray negative for infiltration.  Patient is a smoker, undiagnosed COPD is also possible -Bronchodilators -Solu-Medrol 40 mg twice daily -Z-Pak      DVT ppx: SQ Heparin      Code Status: Full code  Family Communication: not done, no family member is at bed side.     Disposition Plan:  Anticipate discharge back to previous environment  Consults called: Dr. Saunders Revel of Card   Admission status and Level of care: Telemetry Medical:   for obs     Severity of Illness:  The appropriate patient status for this patient is OBSERVATION. Observation status is judged to be reasonable and necessary in order to provide the required intensity of service to ensure the patient's safety. The patient's presenting symptoms, physical exam findings, and initial radiographic and laboratory data in the context of their medical condition is felt to place them at decreased risk for further clinical deterioration. Furthermore, it is anticipated that the patient will be medically stable for discharge from the hospital within 2 midnights of admission.        Date of Service 02/13/2022    [facility]   If 7PM-7AM, please contact night-coverage [email or web address] 02/13/2022, 1:56 PM

## 2022-02-13 NOTE — Plan of Care (Signed)

## 2022-02-13 NOTE — Consult Note (Signed)
Cardiology Consultation:   Patient ID: RUZGAR SASSAMAN; [id number]; 10-Mar-1976   Admit date: 02/13/2022 Date of Consult: 02/13/2022  Primary Care Provider: Reeves Dam, MD Primary Cardiologist: [facility] Primary Electrophysiologist:  None   Patient Profile:   Nathan Garner is a 46 y.o. male with a hx of CAD medically managed as below, poorly controlled IDDM with an A1c 12.7, HTN, HLD, and onoging tobacco use who is being seen today for the evaluation of chest pain at the request of Dr. Blaine Hamper.  History of Present Illness:   Mr. Garner is followed by [facility]. He has undergone prior nuclear stress tests and echocardiograms as outlined below. His most recent ischemic evaluation was a LHC in 04/2021 which demonstrated 25% left main stenosis, large LAD that wrapped the apex with 25% stenosis, D1 with 50% stenosis, LCx less than 25% in the proximal/mid vessel, small (2 mm in diameter) distal LCx with diffuse 80% stenosis, OM1 25% stneosis, 30% proximal RCA and 25% mid RCA stenosis. Medical therapy was recommended, as the distal LCx was too small for PCI. Echo at that time demonstrated an EF of 55-60%, moderate concentric LVH, Gr1DD, and no significant valvular abnormalities.   He presented to [facility] with a 2-day history of sharp chest pain that is worse with positional movement and coughing with radiation to the left arm/digits. Some associated chills and dyspnea. No fevers.   Upon his arrival, his vitals have been stable. He was given ASA 324 mg, IV fentanyl x 3 (25 mg and 50 mg x 2), and insulin. High sensitivity troponin negative x 3. D-dimer negative. He was placed on a heparin gtt. Upon admission, cardiology was consulted. Currently chest pain free.     Past Medical History:  Diagnosis Date   Diabetes ([place])    type 2   Hypercholesterolemia    Hypertension     Past Surgical History:  Procedure Laterality Date   abscess removal Bilateral    inner thighs x 7   lap choley  2013      Home Meds: Prior to Admission medications   Medication Sig Start Date End Date Taking? Authorizing Provider  amLODipine (NORVASC) 5 MG tablet Take 5 mg by mouth daily. 11/07/21  Yes [provider]  aspirin 81 MG EC tablet Take by mouth. 05/11/21  Yes [provider]  atorvastatin (LIPITOR) 40 MG tablet Take 40 mg by mouth daily. 11/08/21  Yes [provider]  DULoxetine (CYMBALTA) 60 MG capsule Take 60 mg by mouth daily. 12/07/21  Yes [provider]  esomeprazole (NEXIUM) 20 MG capsule Take 20 mg by mouth daily. 10/25/21  Yes [provider]  famotidine (PEPCID) 40 MG tablet Take by mouth.   Yes [provider]  gabapentin (NEURONTIN) 300 MG capsule Take 300 mg by mouth at bedtime. 11/17/21  Yes [provider]  insulin glargine (LANTUS) 100 UNIT/ML injection Inject 60 Units into the skin at bedtime.   Yes [provider]  insulin lispro (HUMALOG) 100 UNIT/ML injection Inject into the skin 3 (three) times daily with meals. Per sliding scale.   Yes [provider]  losartan (COZAAR) 25 MG tablet Take 25 mg by mouth daily. 10/31/21  Yes [provider]  metFORMIN (GLUCOPHAGE) 1000 MG tablet Take 1,000 mg by mouth 2 (two) times daily with a meal. 09/01/20  Yes [provider]  metoprolol succinate (TOPROL-XL) 25 MG 24 hr tablet Take 25 mg by mouth daily. 11/08/21  Yes  [provider]  verapamil (CALAN-SR) 240 MG CR tablet Take 240 mg by mouth daily.   Yes [provider]  DULoxetine (CYMBALTA) 30 MG capsule Take 30 mg by mouth daily. Patient not taking: Reported on 02/13/2022 12/07/21   [provider]  emtricitabine-tenofovir (TRUVADA) 200-300 MG tablet Take 1 tablet by mouth daily. Patient not taking: Reported on 02/13/2022 12/02/15   Mancel Bale, PA-C  lacosamide (VIMPAT) 50 MG TABS tablet Take by mouth. Patient not taking: Reported on 02/13/2022 01/09/22   [provider]  Lacosamide 100 MG TABS Take by mouth. Patient not taking: Reported on 02/13/2022 01/16/22 02/15/22  [provider]  pantoprazole (PROTONIX) 40 MG tablet Take by mouth. Patient not taking: Reported on 02/13/2022 08/01/16   [provider]    Inpatient Medications: Scheduled Meds:  [START ON 02/14/2022] aspirin EC  81 mg Oral Daily   heparin  5,000 Units Subcutaneous Q8H   insulin aspart  0-5 Units Subcutaneous QHS   insulin aspart  0-9 Units Subcutaneous TID WC   ipratropium-albuterol  3 mL Nebulization Q6H   nicotine  21 mg Transdermal Daily   Continuous Infusions:  sodium chloride 75 mL/hr at 02/13/22 0815   magnesium sulfate bolus IVPB 2 g (02/13/22 0814)   PRN Meds: acetaminophen **OR** acetaminophen, albuterol, dextromethorphan-guaiFENesin, fentaNYL (SUBLIMAZE) injection, hydrALAZINE, naLOXone (NARCAN)  injection, nitroGLYCERIN, ondansetron (ZOFRAN) IV  Allergies:   Allergies  Allergen Reactions   Bee Venom Anaphylaxis   Lisinopril Anaphylaxis    Other reaction(s): DIFFICULTY SWALLOWING   Oxycodone Itching   Promethazine Nausea And Vomiting    Other reaction(s): NAUSEA   Niacin-Lovastatin Er     REACTION: ANYTHING WITH NIACIN   Niacin Other (See Comments)    flushing flushing     Social History:   Social History   Socioeconomic History   Marital status: Married    Spouse name: Nathan Garner   Number of children: 1   Years of education: 12   Highest education level: Not on file  Occupational History    Comment: [employer]  Tobacco Use   Smoking status: Some Days    Packs/day: 0.30    Years: 18.00    Pack years: 5.40    Types: Cigarettes   Smokeless tobacco: Never  Substance and Sexual Activity   Alcohol use: Not Currently    Comment: monthly beer   Drug use: No   Sexual activity: Not on file  Other Topics Concern   Not on file  Social History Narrative   Lives with wife   caffeine use- Hospital doctor energy drinks, 2 daily    Social Determinants of Health   Financial Resource Strain: Not on file  Food Insecurity: Not on file  Transportation Needs: Not on file  Physical Activity: Not on file  Stress: Not on file  Social Connections: Not on file  Intimate Partner Violence: Not on file     Family History:   Family History  Problem Relation Age of Onset   Diabetes Mother    Cancer Maternal Uncle    Diabetes Maternal Grandmother    Hypertension Maternal Grandmother    Cancer Maternal Grandmother     ROS:  Review of Systems  Constitutional:  Positive for malaise/fatigue. Negative for chills, diaphoresis, fever and weight loss.  HENT:  Negative for congestion.   Eyes:  Negative for discharge and redness.  Respiratory:  Positive for shortness of breath. Negative for cough, sputum production and wheezing.   Cardiovascular:  Positive for chest pain. Negative for palpitations, orthopnea, claudication, leg swelling and PND.  Gastrointestinal:  Negative for abdominal pain, heartburn, nausea and vomiting.  Musculoskeletal:  Negative for falls and myalgias.  Skin:  Negative for rash.  Neurological:  Negative for dizziness, tingling, tremors, sensory change, speech change, focal weakness, loss of consciousness and weakness.  Endo/Heme/Allergies:  Does not bruise/bleed easily.  Psychiatric/Behavioral:  Negative for substance abuse. The patient is not nervous/anxious.   All other systems reviewed and are negative.    Physical Exam/Data:   Vitals:   02/13/22 0430 02/13/22 0500 02/13/22 0530 02/13/22 0600  BP: (!) 151/89 (!) 143/86 136/79 (!) 153/94  Pulse: 98 97 95 96  Resp: 16 17 17 13   Temp:      TempSrc:      SpO2: 100% 97% 98% 94%  Weight:      Height:       No intake or output data in the 24 hours ending 02/13/22 0842 Filed Weights   02/13/22 0231  Weight: 111.1 kg   Body mass index is 34.17 kg/m.   Physical Exam: General: Well developed, well nourished, in no acute distress. Head:  Normocephalic, atraumatic, sclera non-icteric, no xanthomas, nares without discharge.  Neck: Negative for carotid bruits. JVD not elevated. Lungs: Diffuse wheezing, rhonchi, with faint rales along the bases. Breathing is unlabored. Heart: RRR with S1 S2. No murmurs, rubs, or gallops appreciated. Abdomen: Soft, non-tender, non-distended with normoactive bowel sounds. No hepatomegaly. No rebound/guarding. No obvious abdominal masses. Msk:  Strength and tone appear normal for age. Extremities: No clubbing or cyanosis. No edema. Distal pedal pulses are 2+ and equal bilaterally. Neuro: Alert and oriented X 3. No facial asymmetry. No focal deficit. Moves all extremities spontaneously. Psych:  Responds to questions appropriately with a normal affect.   EKG:  The EKG was personally reviewed and demonstrates: sinus tachycardia, 106 bpm, poor R wave progression along the precordial leads, possible prior septal infarct vs lead placement Telemetry:  Telemetry was personally reviewed and demonstrates: SR  Weights: [employer]   02/13/22 0231  Weight: 111.1 kg    Relevant CV Studies:  LHC 05/11/2021 ([facility]): Coronary Angiography  1. Left Main -25% smooth stenosis  2. Left anterior descending artery -large vessel that wraps the apex with  25% stenosis  3. Diagonals -large bifurcating diagonal 1 with 50% smooth stenosis  4. Left Circumflex -less than 25% stenosis in the proximal/mid vessel.    The distal circumflex is a small vessel (2 mm in diameter) with diffuse  80% stenosis.  5. Obtuse Marginals -large OM1 with 25% stenosis.  Moderate sized small to  moderate sized OM 2 with no significant stenosis  6. Right Coronary Artery -30% proximal stenosis, 25% mid stenosis  7. Posterior Descending Artery -no significant stenosis    Hemodynamics  1. Aortic Pressure -135/82 mmHg  __________  Carlton Adam MPI 05/09/2021: IMPRESSION:  1.  Normal gated SPECT myocardial perfusion imaging study with IV  regadenoson. There is no evidence of myocardial ischemia or infarction.  2.  Left ventricular cavity size is normal. Left ventricular systolic function is normal.  Left ventricular wall motion is normal.  The left ventricular ejection fraction is assessed as 56%.  3.  No ischemic ST changes on ECG. No arrhythmias were noted.  4.  Right-sided chest pain was present with IV Regadenoson.  5.  Appropriate blood pressure response to IV Lexiscan.   Prior Study: Abnormal pharmacologic nuclear stress test on 02/06/2018 showing nonischemic cardiomyopathy with  a moderately reduced LVEF 37% but with no ischemia noted.  __________  2D echo 05/08/2021: Normal left ventricular chamber size with moderate concentric LVH and  normal systolic wall motion.    Preserved LVEF 55-60%.  Manual apical biplane EF 57.5%.    Grade 1 diastolic dysfunction.    No significant valvular abnormalities are noted.    Pulmonary artery systolic pressure cannot be determined.    Compared to prior echocardiogram report dated 01/03/2021: There are no significant changes noted. __________  2D echo 01/03/2021: Normal left ventricular chamber size with moderate concentric LVH and  normal systolic wall motion.    Preserved LVEF 55-60%.    Grade 1 diastolic dysfunction.    No significant valvular abnormalities are noted.    Pulmonary artery systolic pressure cannot be determined.    Compared to prior echocardiogram report dated 07/14/2018: There are no significant changes noted. __________  Carlton Adam MPI 02/06/2018:FINDINGS: There is normal physiologic distribution of activity throughout the left ventricular myocardium. No evidence of ischemia. No regional wall motion abnormality is demonstrated. Ejection fraction of 37%.    IMPRESSION: Normal perfusion with no evidence of ischemia. Global hypokinesis with ejection fraction of 37%.    Laboratory Data:  Chemistry Recent Labs  Lab 02/13/22 0242  NA 136  K 3.7  CL 104  CO2 25   GLUCOSE 204*  BUN 16  CREATININE 0.74  CALCIUM 8.8*  GFRNONAA >60  ANIONGAP 7    No results for input(s): PROT, ALBUMIN, AST, ALT, ALKPHOS, BILITOT in the last 168 hours. Hematology Recent Labs  Lab 02/13/22 0242  WBC 10.4  RBC 4.71  HGB 13.0  HCT 39.9  MCV 84.7  MCH 27.6  MCHC 32.6  RDW 14.6  PLT 359   Cardiac EnzymesNo results for input(s): TROPONINI in the last 168 hours. No results for input(s): TROPIPOC in the last 168 hours.  BNPNo results for input(s): BNP, PROBNP in the last 168 hours.  DDimer  Recent Labs  Lab 02/13/22 0317  DDIMER <0.27    Radiology/Studies:  DG Chest 2 View  Result Date: 02/13/2022 IMPRESSION: No active cardiopulmonary disease. Electronically Signed   By: Anner Crete M.D.   On: 02/13/2022 03:00    Assessment and Plan:   1. Atypical chest pain: -Currently, chest pain free -Ruled out, discontinue troponin order -Query if symptoms are related to underlying pulmonary process given abnormal lung exam -Stop heparin gtt -NPO at midnight for Lexiscan MPI 3/1 to evaluate for high risk ischemia, if normal, no further inpatient cardiac testing is recommended with further workup of symptoms per IM  2. Possible bronchitis/COPD: -Start Duonebs -Further management per IM  3. CAD: -LHC in 04/2021 at [facility] with medical management as outlined above -Recommend PTA ASA, Lipitor, Toprol XL, verapamil, and losartan at admission  -Aggressive risk factor modification  -Lexiscan MPI 3/1   4. HTN: -Blood pressure is mildly elevated in the ED -Continue PTA medications at admission as above  5. HLD: -LDL 81 with goal at least < 70 -Recommend titration of PTA Lipitor to 80 mg daily at admission   6. Tobacco use: -Previously smoked 2 packs daily, now several cigarettes daily -Complete cessation is encouraged  -Nicotine patch  7. IDDM: -A1c 12.7 -Per IM   Shared Decision Making/Informed Consent{  The risks [chest pain, shortness of  breath, cardiac arrhythmias, dizziness, blood pressure fluctuations, myocardial infarction, stroke/transient ischemic attack, nausea, vomiting, allergic reaction, radiation exposure, metallic taste sensation and life-threatening complications (estimated to be 1 in  10,000)], benefits (risk stratification, diagnosing coronary artery disease, treatment guidance) and alternatives of a nuclear stress test were discussed in detail with Mr. Cutbirth and he agrees to proceed.    For questions or updates, please contact [place] Please consult [email or web address] for contact info under Cardiology/STEMI.   Signed, Christell Faith, PA-C [place] Pager: [phone] 02/13/2022, 8:42 AM

## 2022-02-13 NOTE — ED Notes (Signed)
2nd ekg done in triage.

## 2022-02-13 NOTE — ED Notes (Signed)
Pt to room, placed on stretcher, EKG obtained.

## 2022-02-13 NOTE — ED Triage Notes (Signed)
Pt brought in via ems from work.  Pt has right side chest pain since yesterday.  Pt has nausea.  Pain radiates into the neck  iv in place  pt alert.

## 2022-02-13 NOTE — Progress Notes (Signed)
°  Carryover admission to the Day Admitter.  I discussed this case with the EDP, Dr. Delton Prairie.  Per these discussions:   This is a 46 year old male with a history of coronary artery disease who is being admitted for further evaluation of his presenting chest pain.  He has a known history of coronary artery disease, with left-sided coronary angiography in May 2022 at [facility] reportedly demonstrating 75% stenosis to the left circumflex, for which he did not receive intervention at that time, including no PCI or stent placement.  He presents this evening with 2 days of substernal chest pressure, associated with dyspnea on exertion, but no exertional component to his chest pain.  Chest pain unchanged with nitroglycerin, but improves with IV fentanyl.  Work-up thus far notable for the following: High-sensitivity troponin I initially noted to be 13, with repeat value trending down slightly to 12.  Chest x-ray reportedly shows no evidence of acute cardiopulmonary process.  EKG reportedly shows sinus rhythm without evidence of STEMI.  He received full dose aspirin.  In the setting of the patient's chest pain with some typical features, in this patient with known 75% stenosis of the left circumflex without interval intervention, the patient is being admitted for overnight observation to the cardiac telemetry unit for further evaluation and management thereof.  I placed order for observation to cardiac telemetry.I have placed some additional preliminary admit orders via the adult multi-morbid admission order set.  I have ordered a third set of troponin to be checked at 9 AM this morning.  Additionally have added on serum magnesium level and ordered prn IV fentanyl.    Newton Pigg, DO Hospitalist

## 2022-02-13 NOTE — ED Provider Notes (Signed)
[facility] Provider Note    Event Date/Time   First MD Initiated Contact with Patient 02/13/22 [phone]     (approximate)   History   Chest Pain   HPI  Nathan Garner is a 46 y.o. male who presents to the ED for evaluation of Chest Pain   Review outpatient cardiology visit from June.  History of obesity, DM tobacco abuse and CAD.  Left heart cath in May 2022 with one-vessel disease 75% stenosis to distal circumflex.  Medical therapy without stenting.  Normal EF.  Patient presents to the ED, accompanied by a friend, for evaluation of chest pain over the past 1-2 days.  Reports associated nausea and has radiation of the pain up to his neck.  Reports pain is substernal and right-sided.  He reports increased dyspnea on exertion and poor exercise tolerance over the past few days.  Denies fever, cough, syncope, emesis.  Coronary angiogram 04/2021 Coronary Angiography 1. Left Main -25% smooth stenosis 2. Left anterior descending artery -large vessel that wraps the apex with 25% stenosis 3. Diagonals -large bifurcating diagonal 1 with 50% smooth stenosis 4. Left Circumflex -less than 25% stenosis in the proximal/mid vessel. The distal circumflex is a small vessel (2 mm in diameter) with diffuse 80% stenosis. 5. Obtuse Marginals -large OM1 with 25% stenosis. Moderate sized small to moderate sized OM 2 with no significant stenosis 6. Right Coronary Artery -30% proximal stenosis, 25% mid stenosis 7. Posterior Descending Artery -no significant stenosis   Physical Exam   Triage Vital Signs: ED Triage Vitals  Enc Vitals Group     BP 02/13/22 0234 (!) 149/91     Pulse Rate 02/13/22 0234 (!) 107     Resp 02/13/22 0234 20     Temp 02/13/22 0234 98.5 F (36.9 C)     Temp Source 02/13/22 0234 Oral     SpO2 02/13/22 0234 96 %     Weight 02/13/22 0231 245 lb (111.1 kg)     Height 02/13/22 0231 5\' 11"  (1.803 m)     Head Circumference --      Peak Flow --      Pain  Score 02/13/22 0231 9     Pain Loc --      Pain Edu? --      Excl. in GC? --     Most recent vital signs: Vitals:   02/13/22 0234 02/13/22 0330  BP: (!) 149/91 (!) 159/101  Pulse: (!) 107 99  Resp: 20 13  Temp: 98.5 F (36.9 C)   SpO2: 96% 100%    General: Awake, no distress.  CV:  Good peripheral perfusion. RRR Resp:  Normal effort. CTAB Abd:  No distention.  Soft and benign MSK:  No deformity noted.  Neuro:  No focal deficits appreciated. Cranial nerves II through XII intact 5/5 strength and sensation in all 4 extremities Other:     ED Results / Procedures / Treatments   Labs (all labs ordered are listed, but only abnormal results are displayed) Labs Reviewed  BASIC METABOLIC PANEL - Abnormal; Notable for the following components:      Result Value   Glucose, Bld 204 (*)    Calcium 8.8 (*)    All other components within normal limits  RESP PANEL BY RT-PCR (FLU A&B, COVID) ARPGX2  CBC  PROTIME-INR  APTT  D-DIMER, QUANTITATIVE  TROPONIN I (HIGH SENSITIVITY)  TROPONIN I (HIGH SENSITIVITY)    EKG Sinus tachycardia with a rate of  102 bpm.  Normal axis and intervals.  Anterior ST elevations and minimal inferior depressions.  Not quite meeting STEMI criteria.  No recent comparisons.  Repeat EKG 30 minutes later is similar  RADIOLOGY CXR reviewed by me without evidence of acute cardiopulmonary pathology.  Official radiology report(s): DG Chest 2 View  Result Date: 02/13/2022 CLINICAL DATA:  Chest pain EXAM: CHEST - 2 VIEW COMPARISON:  Chest radiograph dated 11/17/2021. FINDINGS: The heart size and mediastinal contours are within normal limits. Both lungs are clear. The visualized skeletal structures are unremarkable. IMPRESSION: No active cardiopulmonary disease. Electronically Signed   By: Elgie Collard M.D.   On: 02/13/2022 03:00    PROCEDURES and INTERVENTIONS:  .1-3 Lead EKG Interpretation Performed by: Delton Prairie, MD Authorized by: Delton Prairie, MD      Interpretation: abnormal     ECG rate:  101   ECG rate assessment: tachycardic     Rhythm: sinus tachycardia     Ectopy: none     Conduction: normal    Medications  fentaNYL (SUBLIMAZE) injection 50 mcg (50 mcg Intravenous Given 02/13/22 0321)  fentaNYL (SUBLIMAZE) injection 50 mcg (50 mcg Intravenous Given 02/13/22 0423)     IMPRESSION / MDM / ASSESSMENT AND PLAN / ED COURSE  I reviewed the triage vital signs and the nursing notes.  High risk 46 year old male presents to the ED with chest discomfort concerning for anginal symptoms requiring medical observation admission.  He looks clinically well without evidence of neurologic or vascular deficits.  Pain is not reproducible on palpation.  His EKGs are abnormal with ST elevations anteriorly and mild depressions inferiorly, but not STEMI criteria.  No recent comparisons.  This pain has not improved with nitroglycerin and he received aspirin prehospital.  Troponins are negative x2, and his D-dimer is negative making PE unlikely.  Mild hyperglycemia without acidosis and normal CBC.  Due to his persistent chest discomfort and high risk nature, we will consult with medicine for admission.  Clinical Course as of 02/13/22 0505  Tue Feb 13, 2022  0315 Discussed my concerns with the patient and that he may need to come in the hospital tonight.  He is agreeable. [DS]  5009 Reassessed.  Pain improved but still with some residual chest pressure.  We discussed my recommendation for observation admission and he is agreeable. [DS]    Clinical Course User Index [DS] Delton Prairie, MD     FINAL CLINICAL IMPRESSION(S) / ED DIAGNOSES   Final diagnoses:  Other chest pain     Rx / DC Orders   ED Discharge Orders     None        Note:  This document was prepared using Dragon voice recognition software and may include unintentional dictation errors.   Delton Prairie, MD 02/13/22 [phone]

## 2022-02-14 ENCOUNTER — Encounter: Payer: Self-pay | Admitting: Internal Medicine

## 2022-02-14 ENCOUNTER — Inpatient Hospital Stay (HOSPITAL_BASED_OUTPATIENT_CLINIC_OR_DEPARTMENT_OTHER): Payer: Self-pay

## 2022-02-14 DIAGNOSIS — R079 Chest pain, unspecified: Secondary | ICD-10-CM

## 2022-02-14 DIAGNOSIS — R072 Precordial pain: Secondary | ICD-10-CM

## 2022-02-14 LAB — LIPID PANEL
Cholesterol: 159 mg/dL (ref 0–200)
HDL: 57 mg/dL (ref >40)
LDL Cholesterol: 93 mg/dL (ref 0–99)
Total CHOL/HDL Ratio: 2.8 RATIO
Triglycerides: 43 mg/dL (ref <150)
VLDL: 9 mg/dL (ref 0–40)

## 2022-02-14 LAB — NM MYOCAR MULTI W/SPECT W/WALL MOTION / EF
LV dias vol: 143 mL (ref 62–150)
LV sys vol: 51 mL
Nuc Stress EF: 64 %
Peak HR: 108 {beats}/min
Percent HR: 61 %
Rest HR: 101 {beats}/min
Rest Nuclear Isotope Dose: 10.9 mCi
SDS: 1
SRS: 0
SSS: 0
ST Depression (mm): 0 mm
Stress Nuclear Isotope Dose: 31.5 mCi
TID: 1.28

## 2022-02-14 LAB — CBC
HCT: 40.8 % (ref 39.0–52.0)
Hemoglobin: 13.7 g/dL (ref 13.0–17.0)
MCH: 27.7 pg (ref 26.0–34.0)
MCHC: 33.6 g/dL (ref 30.0–36.0)
MCV: 82.4 fL (ref 80.0–100.0)
Platelets: 360 10*3/uL (ref 150–400)
RBC: 4.95 MIL/uL (ref 4.22–5.81)
RDW: 14.8 % (ref 11.5–15.5)
WBC: 10.7 10*3/uL — ABNORMAL HIGH (ref 4.0–10.5)
nRBC: 0 % (ref 0.0–0.2)

## 2022-02-14 LAB — GLUCOSE, CAPILLARY
Glucose-Capillary: 179 mg/dL — ABNORMAL HIGH (ref 70–99)
Glucose-Capillary: 200 mg/dL — ABNORMAL HIGH (ref 70–99)

## 2022-02-14 MED ORDER — TECHNETIUM TC 99M TETROFOSMIN IV KIT
10.0000 | PACK | Freq: Once | INTRAVENOUS | Status: AC | PRN
  Administered 2022-02-14: 10.9 via INTRAVENOUS

## 2022-02-14 MED ORDER — ATORVASTATIN CALCIUM 80 MG PO TABS
80.0000 mg | ORAL_TABLET | Freq: Every day | ORAL | Status: DC
  Administered 2022-02-14: 80 mg via ORAL
  Filled 2022-02-14: qty 1

## 2022-02-14 MED ORDER — ATORVASTATIN CALCIUM 80 MG PO TABS: 80.0000 mg | ORAL_TABLET | Freq: Every day | ORAL | 0 refills | Status: AC

## 2022-02-14 MED ORDER — REGADENOSON 0.4 MG/5ML IV SOLN
0.4000 mg | Freq: Once | INTRAVENOUS | Status: AC
  Administered 2022-02-14: 0.4 mg via INTRAVENOUS
  Filled 2022-02-14: qty 5

## 2022-02-14 MED ORDER — TECHNETIUM TC 99M TETROFOSMIN IV KIT
30.0000 | PACK | Freq: Once | INTRAVENOUS | Status: AC | PRN
  Administered 2022-02-14: 31.5 via INTRAVENOUS

## 2022-02-14 MED ORDER — MAGNESIUM SULFATE 2 GM/50ML IV SOLN
2.0000 g | Freq: Once | INTRAVENOUS | Status: AC
  Administered 2022-02-14: 2 g via INTRAVENOUS
  Filled 2022-02-14: qty 50

## 2022-02-14 MED ORDER — ACETAMINOPHEN 325 MG PO TABS: 650.0000 mg | ORAL_TABLET | Freq: Four times a day (QID) | ORAL | Status: DC | PRN

## 2022-02-14 MED ORDER — ACETAMINOPHEN 650 MG RE SUPP: 650.0000 mg | Freq: Four times a day (QID) | RECTAL | Status: DC | PRN

## 2022-02-14 MED ORDER — ISOSORBIDE MONONITRATE ER 30 MG PO TB24
30.0000 mg | ORAL_TABLET | Freq: Every day | ORAL | Status: DC
  Administered 2022-02-14: 30 mg via ORAL
  Filled 2022-02-14: qty 1

## 2022-02-14 MED ORDER — ISOSORBIDE MONONITRATE ER 30 MG PO TB24: 30.0000 mg | ORAL_TABLET | Freq: Every day | ORAL | 0 refills | Status: AC

## 2022-02-14 MED ORDER — INSULIN GLARGINE 100 UNIT/ML ~~LOC~~ SOLN
60.0000 [IU] | Freq: Every day | SUBCUTANEOUS | 0 refills | Status: AC
Start: 2022-02-14 — End: ?

## 2022-02-14 MED ORDER — DOXYCYCLINE HYCLATE 50 MG PO CAPS: 50.0000 mg | ORAL_CAPSULE | Freq: Two times a day (BID) | ORAL | 0 refills | Status: AC

## 2022-02-14 NOTE — Progress Notes (Addendum)
Inpatient Diabetes Program Recommendations ? ?AACE/ADA: New Consensus Statement on Inpatient Glycemic Control  ? ?Target Ranges:  Prepandial:   less than 140 mg/dL ?     Peak postprandial:   less than 180 mg/dL (1-2 hours) ?     Critically ill patients:  140 - 180 mg/dL  ? ? Latest Reference Range & Units 02/13/22 08:08 02/13/22 11:05 02/13/22 17:08 02/13/22 17:24 02/13/22 20:52 02/14/22 07:21  ?Glucose-Capillary 70 - 99 mg/dL 176 (H) 221 (H) 274 (H) 281 (H) 319 (H) 179 (H)  ? ? Latest Reference Range & Units 02/13/22 08:02  ?Hemoglobin A1C 4.8 - 5.6 % 10.6 (H)  ? ?Review of Glycemic Control ? ?Diabetes history: DM2 ?Outpatient Diabetes medications: Lantus 60 units QHS, Humalog 7-17 units TID with meals, Metformin 1000 mg BID ?Current orders for Inpatient glycemic control: Semglee 40 units QHS, Novolog 0-9 units TID with meals, Novolog 0-5 units QHS; Solumedrol 80 mg daily ? ?Inpatient Diabetes Program Recommendations:   ? ?Insulin: If steroids are continued and if post prandial glucose is consistently over 180 mg/dl, please consider ordering Novolog 4 units TID with meals for meal coverage if patient eats at least 50% of meals. ? ?HbgA1C: A1C 10.6% on 02/13/22 indicating an average glucose of 258 mg/dl over the past 2-3 months. Last A1C 12.7% on 10/25/21 (noted in [place]). ? ?Addendum 02/14/22@11 :25-Spoke with patient over the phone to inquire about DM medications. Patient states that he is taking Lantus 60 units QHS, Humalog 7-17 units TID with meals, and Metformin 1000 mg BID. Patient reports that fasting glucose is usually in the mid 100's but glucose goes up in 200's during the day. Discussed current A1C of 10.6% on 01/2822 which is an improvement from last A1C of 12.7% on 10/25/21.  Patient states that he was eating a lot of fruit salads but he has changed his diet and has cut out the fruit salads and glucose has been better with that change. Discussed that insulin regimen may need to be adjusted if glucose  is consistently in 200's during the day. Patient states that he plans to get established with an Endocrinologist. Patient reports that he is about out of all medications and will need refills at discharge. Patient states he is using vials of Lantus and Humalog. Patient reports he gets medications filled at [facility] and no issues with getting medications. Will ask that Rx for discharge medications be provided.  Encouraged patient to follow up with PCP and get appointment with Endocrinologist to establish care. Patient verbalized understanding of information discussed and states he has no questions at this time. ? ?Thanks, ?Barnie Alderman, RN, MSN, CDE ?Diabetes Coordinator ?Inpatient Diabetes Program ?[phone] (Team Pager from 8am to 5pm) ? ? ?

## 2022-02-14 NOTE — Progress Notes (Signed)
Pt transported to  nuclear Med for Stress test. ?

## 2022-02-14 NOTE — Progress Notes (Signed)
Discharge instructions explained to pt and pts spouse / verbalized an understanding/ iv and tele removed/ work note provided/ transported off unit via wheelchair ?

## 2022-02-14 NOTE — Progress Notes (Signed)
? ? ?Progress Note ? ?Patient Name: Nathan Garner ?Date of Encounter: 02/14/2022 ? ?Primary Cardiologist: [facility] ? ?Subjective  ? ?Dyspnea is improving with nebs and steroids. Chest pressure remains intermittent and largely unchanged.  ? ?Inpatient Medications  ?  ?Scheduled Meds: ? amLODipine  5 mg Oral Daily  ? aspirin EC  81 mg Oral Daily  ? atorvastatin  40 mg Oral Daily  ? azithromycin  250 mg Oral Daily  ? DULoxetine  60 mg Oral Daily  ? famotidine  20 mg Oral Daily  ? heparin  5,000 Units Subcutaneous Q8H  ? insulin aspart  0-5 Units Subcutaneous QHS  ? insulin aspart  0-9 Units Subcutaneous TID WC  ? insulin glargine-yfgn  40 Units Subcutaneous QHS  ? ipratropium-albuterol  3 mL Nebulization Q6H  ? losartan  25 mg Oral Daily  ? methylPREDNISolone (SOLU-MEDROL) injection  80 mg Intravenous Daily  ? metoprolol succinate  25 mg Oral Daily  ? nicotine  21 mg Transdermal Daily  ? pantoprazole  40 mg Oral Daily  ? ?Continuous Infusions: ? sodium chloride 75 mL/hr at 02/14/22 1017  ? ?PRN Meds: ?acetaminophen **OR** acetaminophen, albuterol, dextromethorphan-guaiFENesin, fentaNYL (SUBLIMAZE) injection, gabapentin, hydrALAZINE, naLOXone (NARCAN)  injection, nitroGLYCERIN, ondansetron (ZOFRAN) IV  ? ?Vital Signs  ?  ?Vitals:  ? 02/14/22 5102 02/14/22 0536 02/14/22 0717 02/14/22 5852  ?BP:  (!) 162/92  (!) 138/93  ?Pulse:  92  93  ?Resp:  18  19  ?Temp:  97.7 ?F (36.5 ?C)  98.6 ?F (37 ?C)  ?TempSrc:      ?SpO2:  100% 98% 100%  ?Weight: 116.2 kg     ?Height:      ? ?No intake or output data in the 24 hours ending 02/14/22 0744 ?Filed Weights  ? 02/13/22 0231 02/14/22 0438  ?Weight: 111.1 kg 116.2 kg  ? ? ?Telemetry  ?  ?SR - Personally Reviewed ? ?ECG  ?  ?No new tracings - Personally Reviewed ? ?Physical Exam  ? ?GEN: No acute distress.   ?Neck: No JVD. ?Cardiac: RRR, no murmurs, rubs, or gallops.  ?Respiratory: Improving coarse breath sounds bilaterally.  ?GI: Soft, nontender, non-distended.   ?MS: No edema; No  deformity. ?Neuro:  Alert and oriented x 3; Nonfocal.  ?Psych: Normal affect. ? ?Labs  ?  ?Chemistry ?Recent Labs  ?Lab 02/13/22 ?0242 02/13/22 ?0802  ?NA 136  --   ?K 3.7  --   ?CL 104  --   ?CO2 25  --   ?GLUCOSE 204*  --   ?BUN 16  --   ?CREATININE 0.74 0.66  ?CALCIUM 8.8*  --   ?GFRNONAA >60 >60  ?ANIONGAP 7  --   ?  ? ?Hematology ?Recent Labs  ?Lab 02/13/22 ?0242 02/13/22 ?0802 02/14/22 ?7782  ?WBC 10.4 8.9 10.7*  ?RBC 4.71 4.67 4.95  ?HGB 13.0 12.9* 13.7  ?HCT 39.9 39.8 40.8  ?MCV 84.7 85.2 82.4  ?MCH 27.6 27.6 27.7  ?MCHC 32.6 32.4 33.6  ?RDW 14.6 14.8 14.8  ?PLT 359 356 360  ? ? ?Cardiac EnzymesNo results for input(s): TROPONINI in the last 168 hours. No results for input(s): TROPIPOC in the last 168 hours.  ? ?BNPNo results for input(s): BNP, PROBNP in the last 168 hours.  ? ?DDimer  ?Recent Labs  ?Lab 02/13/22 ?4235  ?DDIMER <0.27  ?  ? ?Radiology  ?  ?DG Chest 2 View ? ?Result Date: 02/13/2022 ?IMPRESSION: No active cardiopulmonary disease. Electronically Signed   By: Burtis Junes  Radparvar M.D.   On: 02/13/2022 03:00   ? ?Cardiac Studies  ? ?LHC 05/11/2021 ([facility]): ?Coronary Angiography  ?1. Left Main -25% smooth stenosis  ?2. Left anterior descending artery -large vessel that wraps the apex with  ?25% stenosis  ?3. Diagonals -large bifurcating diagonal 1 with 50% smooth stenosis  ?4. Left Circumflex -less than 25% stenosis in the proximal/mid vessel.    ?The distal circumflex is a small vessel (2 mm in diameter) with diffuse  ?80% stenosis.  ?5. Obtuse Marginals -large OM1 with 25% stenosis.  Moderate sized small to  ?moderate sized OM 2 with no significant stenosis  ?6. Right Coronary Artery -30% proximal stenosis, 25% mid stenosis  ?7. Posterior Descending Artery -no significant stenosis  ? ? ?Hemodynamics  ?1. Aortic Pressure -135/82 mmHg  ?__________ ?  ?Lexiscan MPI 05/09/2021: ?IMPRESSION:  ?1.  Normal gated SPECT myocardial perfusion imaging study with IV regadenoson. There is no evidence of myocardial  ischemia or infarction.  ?2.  Left ventricular cavity size is normal. Left ventricular systolic function is normal.  Left ventricular wall motion is normal.  The left ventricular ejection fraction is assessed as 56%.  ?3.  No ischemic ST changes on ECG. No arrhythmias were noted.  ?4.  Right-sided chest pain was present with IV Regadenoson.  ?5.  Appropriate blood pressure response to IV Lexiscan.  ? ?Prior Study: Abnormal pharmacologic nuclear stress test on 02/06/2018 showing nonischemic cardiomyopathy with a moderately reduced LVEF 37% but with no ischemia noted.  ?__________ ?  ?2D echo 05/08/2021: ?Normal left ventricular chamber size with moderate concentric LVH and  ?normal systolic wall motion.  ?  Preserved LVEF 55-60%.  Manual apical biplane EF 57.5%.  ?  Grade 1 diastolic dysfunction.  ?  No significant valvular abnormalities are noted.  ?  Pulmonary artery systolic pressure cannot be determined.  ?  Compared to prior echocardiogram report dated 01/03/2021: There are no significant changes noted. ?__________ ?  ?2D echo 01/03/2021: ?Normal left ventricular chamber size with moderate concentric LVH and  ?normal systolic wall motion.  ?  Preserved LVEF 55-60%.  ?  Grade 1 diastolic dysfunction.  ?  No significant valvular abnormalities are noted.  ?  Pulmonary artery systolic pressure cannot be determined.  ?  Compared to prior echocardiogram report dated 07/14/2018: There are no significant changes noted. ?__________ ?  ?Lexiscan MPI 02/06/2018:FINDINGS: There is normal physiologic distribution of activity throughout the left ventricular myocardium. No evidence of ischemia. No regional wall motion abnormality is demonstrated. Ejection fraction of 37%.  ? ? ?IMPRESSION: Normal perfusion with no evidence of ischemia. Global hypokinesis with ejection fraction of 37%. ? ?Patient Profile  ?   ?46 y.o. male with history of CAD medically managed as below, poorly controlled IDDM with an A1c 12.7, HTN, HLD, and onoging  tobacco use who is being seen today for the evaluation of chest pain at the request of Dr. Clyde Lundborg. ? ?Assessment & Plan  ?  ?1. Chest pain: ?-Currently, chest pain free ?-Ruled out ?-Query if symptoms are related to underlying pulmonary process given abnormal lung exam ?-No indication for heparin gtt ?-NPO ?-Lexiscan MPI today to evaluate for high risk ischemia, if normal, no further inpatient cardiac testing is recommended with further workup of symptoms per IM ?  ?2. Possible bronchitis/COPD: ?-Per IM ?  ?3. CAD: ?-LHC in 04/2021 at [facility] with medical management as outlined above ?-PTA ASA, Lipitor, Toprol XL, and losartan  ?-Aggressive risk factor modification  ?-Lexiscan MPI  3/1  ?  ?4. HTN: ?-Blood pressure is reasonably controlled ?-Continue PTA medications  ?  ?5. HLD: ?-LDL 81 with goal at least < 70 ?-Titrate PTA Lipitor to 80 mg  ?  ?6. Tobacco use: ?-Previously smoked 2 packs daily, now several cigarettes daily ?-Complete cessation is encouraged  ?-Nicotine patch ?  ?7. IDDM: ?-A1c 12.7 ?-Per IM ? ? ?Shared Decision Making/Informed Consent{ ? ?The risks [chest pain, shortness of breath, cardiac arrhythmias, dizziness, blood pressure fluctuations, myocardial infarction, stroke/transient ischemic attack, nausea, vomiting, allergic reaction, radiation exposure, metallic taste sensation and life-threatening complications (estimated to be 1 in 10,000)], benefits (risk stratification, diagnosing coronary artery disease, treatment guidance) and alternatives of a nuclear stress test were discussed in detail with Mr. Tandy and he agrees to proceed.  ? ?For questions or updates, please contact [facility] ?Please consult [email or web address] for contact info under Cardiology/STEMI. ?  ? ?Signed, ?Eula Listen, PA-C ?[facility] ?Pager: [phone] ?02/14/2022, 7:44 AM ? ?

## 2022-02-14 NOTE — Discharge Summary (Signed)
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  Diet recommendation:  Diet Order             Diet heart healthy/carb modified Room service appropriate? Yes; Fluid consistency: Thin  Diet effective now           Diet - low sodium heart healthy           Diet Carb Modified                     Code Status: Full Code     Hospital Course:   Nathan Garner is a 46 y.o. male with medical history significant of CAD, HTH, HLD, DM, depression, CAD, tobacco abuse, obesity with BMI 34.17, who presented to the hospital with chest pain.  He previously had [facility] heart [facility] which showed 80% stenosis in the distal circumflex artery which was not amenable to stenting.  He was admitted to the hospital for observation.  Troponins were negative.  He underwent nuclear stress test which did not show any reversible ischemia.  He was evaluated by the cardiologist and no further work-up was recommended.  He was also given steroids bronchodilators and azithromycin for suspected acute bronchitis.  Of note, he complained of small abscess on the right scrotum which he had had for a few weeks.  He said the abscess ruptured in the hospital.  Because of history of diabetes, he was prescribed doxycycline at discharge.  His symptoms have improved  and he is deemed stable for discharge to home.  Close follow-up with PCP and cardiologist was recommended.     Medical Consultants:   Cardiologist   Discharge Exam:    Vitals:   02/14/22 0722 02/14/22 1000 02/14/22 1107 02/14/22 1433  BP: (!) 138/93  (!) 147/85   Pulse: 93  100   Resp: 19 20 19    Temp: 98.6 F (37 C)  98 F (36.7 C)   TempSrc:      SpO2: 100%  96% 99%  Weight:      Height:         GEN: NAD SKIN: ruptured boil on the right scrotum EYES: EOMI ENT: MMM CV: RRR PULM: CTA B ABD: soft, obese, NT, +BS CNS: AAO x 3, non focal EXT: No edema or tenderness   The results of significant diagnostics from this hospitalization (including imaging, microbiology, ancillary and laboratory) are listed below for reference.     Procedures and Diagnostic Studies:   DG Chest 2 View  Result Date: 02/13/2022 CLINICAL DATA:  Chest pain EXAM: CHEST - 2 VIEW COMPARISON:  Chest radiograph dated 11/17/2021. FINDINGS: The heart size and mediastinal contours are within normal limits. Both lungs are clear. The visualized skeletal structures are unremarkable. IMPRESSION: No active cardiopulmonary disease. Electronically Signed   By: Burtis JunesArash  Radparvar M.D.   On: 02/13/2022 03:00   NM Myocar Multi W/Spect W/Wall Motion / EF  Result Date: 02/14/2022   The study is low risk with no evidence for ischemia.   No ST deviation was noted.   There is normal perfusion   LV function appears normal visually. Calculated LVEF is 43%. Correlation with Echo advised.   Coronary artery calcifications noted in the LAD and RCA.     Labs:   Basic Metabolic Panel: Recent Labs  Lab 02/13/22 0242 02/13/22 0317 02/13/22 0802  NA 136  --   --   K 3.7  --   --   CL 104  --   --   CO2 25  --   --   GLUCOSE 204*  --   --   BUN 16  --   --   CREATININE 0.74  --  0.66  CALCIUM 8.8*  --   --   MG  --  1.6*  --    GFR Estimated Creatinine Clearance: 152.8 mL/min (by C-G formula based on SCr of 0.66  mg/dL). Liver Function Tests: No results for input(s): AST, ALT, ALKPHOS, BILITOT, PROT, ALBUMIN in the last 168 hours. No results for input(s): LIPASE, AMYLASE in the last 168 hours. No results for input(s): AMMONIA in the last 168 hours. Coagulation profile Recent Labs  Lab 02/13/22 0317  INR 0.9    CBC: Recent Labs  Lab 02/13/22 0242 02/13/22 0802 02/14/22 0628  WBC 10.4 8.9 10.7*  HGB 13.0 12.9* 13.7  HCT 39.9 39.8 40.8  MCV 84.7 85.2 82.4  PLT 359 356 360   Cardiac Enzymes: No results for input(s): CKTOTAL, CKMB, CKMBINDEX, TROPONINI in the last 168 hours. BNP: Invalid input(s): POCBNP CBG: Recent Labs  Lab 02/13/22 1708 02/13/22 1724 02/13/22 2052 02/14/22 0721 02/14/22 1108  GLUCAP 274* 281* 319* 179* 200*   D-Dimer Recent Labs    02/13/22 0317  DDIMER <0.27   Hgb A1c Recent Labs    02/13/22 0802  HGBA1C 10.6*   Lipid Profile Recent Labs    02/14/22 0628  CHOL 159  HDL 57  LDLCALC 93  TRIG 43  CHOLHDL 2.8   Thyroid function studies No results for input(s): TSH, T4TOTAL, T3FREE, THYROIDAB in the last 72 hours.  Invalid input(s): FREET3 Anemia work up No results for input(s): VITAMINB12, FOLATE, FERRITIN, TIBC, IRON, RETICCTPCT in the last 72 hours. Microbiology Recent Results (from the past 240 hour(s))  Resp Panel by RT-PCR (Flu A&B, Covid) Nasopharyngeal Swab     Status: None   Collection Time: 02/13/22  3:17 AM   Specimen: Nasopharyngeal Swab; Nasopharyngeal(NP) swabs in vial transport medium  Result Value Ref Range Status   SARS Coronavirus 2 by RT PCR NEGATIVE NEGATIVE Final    Comment: (NOTE) SARS-CoV-2 target nucleic acids are NOT DETECTED.  The SARS-CoV-2 RNA is generally detectable in upper respiratory specimens during the acute phase of infection. The lowest concentration of SARS-CoV-2 viral copies this assay can detect is 138 copies/mL. A negative result does not preclude SARS-Cov-2 infection and should not be used as  the sole basis for treatment or other patient management decisions. A negative result may occur with  improper specimen collection/handling, submission of specimen other than nasopharyngeal swab, presence of viral mutation(s) within the areas targeted by this assay, and inadequate number of viral copies(<138 copies/mL). A negative result must be combined with clinical observations, patient history, and epidemiological information. The expected result is Negative.  Fact Sheet for  Patients:  BloggerCourse.com  Fact Sheet for Healthcare Providers:  SeriousBroker.it  This test is no t yet approved or cleared by the [place] FDA and  has been authorized for detection and/or diagnosis of SARS-CoV-2 by FDA under an Emergency Use Authorization (EUA). This EUA will remain  in effect (meaning this test can be used) for the duration of the COVID-19 declaration under Section 564(b)(1) of the Act, 21 U.S.C.section 360bbb-3(b)(1), unless the authorization is terminated  or revoked sooner.       Influenza A by PCR NEGATIVE NEGATIVE Final   Influenza B by PCR NEGATIVE NEGATIVE Final    Comment: (NOTE) The Xpert Xpress SARS-CoV-2/FLU/RSV plus assay is intended as an aid in the diagnosis of influenza from Nasopharyngeal swab specimens and should not be used as a sole basis for treatment. Nasal washings and aspirates are unacceptable for Xpert Xpress SARS-CoV-2/FLU/RSV testing.  Fact Sheet for Patients: BloggerCourse.com  Fact Sheet for Healthcare Providers: SeriousBroker.it  This test is not yet approved or cleared by the [place] FDA and has been authorized for detection and/or diagnosis of SARS-CoV-2 by FDA under an Emergency Use Authorization (EUA). This EUA will remain in effect (meaning this test can be used) for the duration of the COVID-19 declaration under Section 564(b)(1) of  the Act, 21 U.S.C. section 360bbb-3(b)(1), unless the authorization is terminated or revoked.  Performed at [facility], [address] [place], [place] [address]      Discharge Instructions:   Discharge Instructions     Diet - low sodium heart healthy   Complete by: As directed    Diet Carb Modified   Complete by: As directed    Increase activity slowly   Complete by: As directed       Allergies as of 02/14/2022       Reactions   Bee Venom Anaphylaxis   Lisinopril Anaphylaxis   Other reaction(s): DIFFICULTY SWALLOWING   Oxycodone Itching   Promethazine Nausea And Vomiting   Other reaction(s): NAUSEA   Niacin-lovastatin Er    REACTION: ANYTHING WITH NIACIN   Niacin Other (See Comments)   flushing flushing        Medication List     STOP taking these medications    amLODipine 5 MG tablet Commonly known as: NORVASC   DULoxetine 30 MG capsule Commonly known as: CYMBALTA   DULoxetine 60 MG capsule Commonly known as: CYMBALTA   emtricitabine-tenofovir 200-300 MG tablet Commonly known as: Truvada   esomeprazole 20 MG capsule Commonly known as: NEXIUM   Lacosamide 100 MG Tabs   lacosamide 50 MG Tabs tablet Commonly known as: VIMPAT   pantoprazole 40 MG tablet Commonly known as: PROTONIX       TAKE these medications    aspirin 81 MG EC tablet Take by mouth.   atorvastatin 80 MG tablet Commonly known as: LIPITOR Take 1 tablet (80 mg total) by mouth daily. What changed:  medication strength how much to take   doxycycline 50 MG capsule Commonly known as: VIBRAMYCIN Take 1 capsule (50 mg total) by mouth 2 (two) times daily for 5 days.   famotidine 40 MG tablet Commonly known as: PEPCID Take by mouth.   gabapentin 300 MG capsule Commonly known as: NEURONTIN Take 300 mg by mouth at bedtime.   insulin glargine 100 UNIT/ML injection Commonly known as: LANTUS Inject 0.6 mLs (60 Units total) into the skin at bedtime.    insulin lispro 100 UNIT/ML injection Commonly known as: HUMALOG Inject into  the skin 3 (three) times daily with meals. Per sliding scale.   isosorbide mononitrate 30 MG 24 hr tablet Commonly known as: IMDUR Take 1 tablet (30 mg total) by mouth daily. Start taking on: February 15, 2022   losartan 25 MG tablet Commonly known as: COZAAR Take 25 mg by mouth daily.   metFORMIN 1000 MG tablet Commonly known as: GLUCOPHAGE Take 1,000 mg by mouth 2 (two) times daily with a meal.   metoprolol succinate 25 MG 24 hr tablet Commonly known as: TOPROL-XL Take 25 mg by mouth daily.   verapamil 240 MG CR tablet Commonly known as: CALAN-SR Take 240 mg by mouth daily.        Follow-up Information     End, Cristal Deer, MD. Schedule an appointment as soon as possible for a visit in 1 week(s).   Specialty: Cardiology Contact information: [address] [place] [address] [phone]                   If you experience worsening of your admission symptoms, develop shortness of breath, life threatening emergency, suicidal or homicidal thoughts you must seek medical attention immediately by calling 911 or calling your MD immediately  if symptoms less severe.   You must read complete instructions/literature along with all the possible adverse reactions/side effects for all the medicines you take and that have been prescribed to you. Take any new medicines after you have completely understood and accept all the possible adverse reactions/side effects.    Please note   You were cared for by a hospitalist during your hospital stay. If you have any questions about your discharge medications or the care you received while you were in the hospital after you are discharged, you can call the unit and asked to speak with the hospitalist on call if the hospitalist that took care of you is not available. Once you are discharged, your primary care physician will handle any further  medical issues. Please note that NO REFILLS for any discharge medications will be authorized once you are discharged, as it is imperative that you return to your primary care physician (or establish a relationship with a primary care physician if you do not have one) for your aftercare needs so that they can reassess your need for medications and monitor your lab values.       Time coordinating discharge: Greater than 30 minutes  Signed:  Madason Rauls  [facility] 02/14/2022, 4:33 PM   Pager on [email or web address]. If 7PM-7AM, please contact night-coverage at [email or web address]

## 2022-02-14 NOTE — Progress Notes (Signed)
Pt returned  to room from nuclear med , A/Qx4, complained of CP 7/10 and prn fentanyl iv given as ordered. MD at bedside and he will review pt pain regimen. ? ?

## 2022-02-15 LAB — HIV ANTIBODY (ROUTINE TESTING W REFLEX): HIV Screen 4th Generation wRfx: NONREACTIVE

## 2022-06-03 ENCOUNTER — Emergency Department: Payer: 59

## 2022-06-03 ENCOUNTER — Emergency Department
Admission: EM | Admit: 2022-06-03 | Discharge: 2022-06-04 | Disposition: A | Discharge status: Home / Self Care | Payer: 59 | Attending: Emergency Medicine | Admitting: Emergency Medicine

## 2022-06-03 ENCOUNTER — Other Ambulatory Visit: Payer: Self-pay

## 2022-06-03 DIAGNOSIS — I10 Essential (primary) hypertension: Secondary | ICD-10-CM | POA: Diagnosis not present

## 2022-06-03 DIAGNOSIS — R1031 Right lower quadrant pain: Secondary | ICD-10-CM | POA: Insufficient documentation

## 2022-06-03 DIAGNOSIS — E119 Type 2 diabetes mellitus without complications: Secondary | ICD-10-CM | POA: Diagnosis not present

## 2022-06-03 DIAGNOSIS — R109 Unspecified abdominal pain: Secondary | ICD-10-CM

## 2022-06-03 LAB — CBC
HCT: 42.2 % (ref 39.0–52.0)
Hemoglobin: 14.3 g/dL (ref 13.0–17.0)
MCH: 27.9 pg (ref 26.0–34.0)
MCHC: 33.9 g/dL (ref 30.0–36.0)
MCV: 82.4 fL (ref 80.0–100.0)
Platelets: 410 10*3/uL — ABNORMAL HIGH (ref 150–400)
RBC: 5.12 MIL/uL (ref 4.22–5.81)
RDW: 13.8 % (ref 11.5–15.5)
WBC: 14.3 10*3/uL — ABNORMAL HIGH (ref 4.0–10.5)
nRBC: 0 % (ref 0.0–0.2)

## 2022-06-03 LAB — COMPREHENSIVE METABOLIC PANEL
ALT: 36 U/L (ref 0–44)
AST: 23 U/L (ref 15–41)
Albumin: 4.1 g/dL (ref 3.5–5.0)
Alkaline Phosphatase: 92 U/L (ref 38–126)
Anion gap: 13 (ref 5–15)
BUN: 23 mg/dL — ABNORMAL HIGH (ref 6–20)
CO2: 20 mmol/L — ABNORMAL LOW (ref 22–32)
Calcium: 9.8 mg/dL (ref 8.9–10.3)
Chloride: 103 mmol/L (ref 98–111)
Creatinine, Ser: 0.82 mg/dL (ref 0.61–1.24)
GFR, Estimated: >60 mL/min (ref >60)
Glucose, Bld: 239 mg/dL — ABNORMAL HIGH (ref 70–99)
Potassium: 3.9 mmol/L (ref 3.5–5.1)
Sodium: 136 mmol/L (ref 135–145)
Total Bilirubin: 0.7 mg/dL (ref 0.3–1.2)
Total Protein: 8.6 g/dL — ABNORMAL HIGH (ref 6.5–8.1)

## 2022-06-03 LAB — TROPONIN I (HIGH SENSITIVITY): Troponin I (High Sensitivity): 20 ng/L — ABNORMAL HIGH (ref <18)

## 2022-06-03 MED ORDER — FENTANYL CITRATE PF 50 MCG/ML IJ SOSY
50.0000 ug | PREFILLED_SYRINGE | Freq: Once | INTRAMUSCULAR | Status: AC
  Administered 2022-06-04: 50 ug via INTRAVENOUS
  Filled 2022-06-03: qty 1

## 2022-06-03 MED ORDER — ONDANSETRON HCL 4 MG/2ML IJ SOLN
4.0000 mg | Freq: Once | INTRAMUSCULAR | Status: AC
  Administered 2022-06-04: 4 mg via INTRAVENOUS
  Filled 2022-06-03: qty 2

## 2022-06-03 MED ORDER — LABETALOL HCL 5 MG/ML IV SOLN: 20.0000 mg | Freq: Once | INTRAVENOUS | Status: DC

## 2022-06-03 MED ORDER — MORPHINE SULFATE (PF) 4 MG/ML IV SOLN
4.0000 mg | Freq: Once | INTRAVENOUS | Status: AC
Start: 2022-06-03 — End: 2022-06-03
  Administered 2022-06-03: 4 mg via INTRAVENOUS
  Filled 2022-06-03: qty 1

## 2022-06-03 MED ORDER — IOHEXOL 350 MG/ML SOLN
100.0000 mL | Freq: Once | INTRAVENOUS | Status: AC | PRN
  Administered 2022-06-03: 100 mL via INTRAVENOUS

## 2022-06-03 MED ORDER — ONDANSETRON HCL 4 MG/2ML IJ SOLN
4.0000 mg | Freq: Once | INTRAMUSCULAR | Status: AC
  Administered 2022-06-03: 4 mg via INTRAVENOUS
  Filled 2022-06-03: qty 2

## 2022-06-03 NOTE — ED Triage Notes (Addendum)
Pt complains of chest pain, right groin pain. Pt with noted sweating and tachycardia. Pt states chest pain began today. Pt states he recently had surgery this week on his right wrist. Pt appears in distress. Pt with a history of HTN, but has not missed medications.

## 2022-06-03 NOTE — ED Provider Notes (Signed)
[facility] Provider Note    Event Date/Time   First MD Initiated Contact with Patient 06/03/22 2259     (approximate)   History   Chest Pain and Groin Pain   HPI  Nathan Garner is a 46 y.o. male with a history of diabetes, hypertension, hyperlipidemia who presents for evaluation of right-sided groin pain.  Patient reports that he has been having pain in his right groin radiating down into his right thigh now for about a week.  The pain is not in his testicle or scrotum. The pain is most in the suprapubic region towards the R pelvis area. The pain initially was not very severe and intermittent but the pain has now become constant and pretty severe.  He reports that he feels like there is something pushing in that area but he does not really see any bulges.  He denies scrotal pain or swelling, penile discharge, dysuria or hematuria, flank pain.  Patient reports that the pain feels almost like a shingles pain that he has had before but he has been ongoing for a week and he does not notice any rashes.  Said the pain was so severe this evening that he started having chest pain as well.  He reports that every time he has any type of pain or stress his blood pressure goes up and he develops heaviness in his chest.  The chest pain has now subsided.  Patient denies any pleuritic chest pain, personal or family history of PE or DVT, leg pain or swelling, hemoptysis or exogenous hormones patient had surgery this week but he had endoscopic carpal tunnel release     Past Medical History:  Diagnosis Date   Diabetes (HCC)    type 2   Hypercholesterolemia    Hypertension     Past Surgical History:  Procedure Laterality Date   abscess removal Bilateral    inner thighs x 7   lap choley  2013     Physical Exam   Triage Vital Signs: ED Triage Vitals  Enc Vitals Group     BP 06/03/22 2219 (!) 260/142     Pulse Rate 06/03/22 2219 (!) 130     Resp 06/03/22 2219 (!) 26      Temp 06/03/22 2251 98.4 F (36.9 C)     Temp Source 06/03/22 2251 Oral     SpO2 06/03/22 2219 96 %     Weight 06/03/22 2219 251 lb (113.9 kg)     Height 06/03/22 2219 6' (1.829 m)     Head Circumference --      Peak Flow --      Pain Score 06/03/22 2219 10     Pain Loc --      Pain Edu? --      Excl. in GC? --     Most recent vital signs: Vitals:   06/03/22 2300 06/04/22 0100  BP: (!) 154/89 (!) 168/100  Pulse: (!) 110 (!) 111  Resp: 20 16  Temp:    SpO2: 97% 97%     Constitutional: Alert and oriented. Well appearing and in no apparent distress. HEENT:      Head: Normocephalic and atraumatic.         Eyes: Conjunctivae are normal. Sclera is non-icteric.       Mouth/Throat: Mucous membranes are moist.       Neck: Supple with no signs of meningismus. Cardiovascular: Regular rate and rhythm. No murmurs, gallops, or rubs. 2+ symmetrical distal  pulses are present in all extremities.  Respiratory: Normal respiratory effort. Lungs are clear to auscultation bilaterally.  Gastrointestinal: Soft, non tender, and non distended with positive bowel sounds. No rebound or guarding. Genitourinary: Bilateral testicles are descended with no tenderness to palpation, bilateral positive cremasteric reflexes are present, no swelling or erythema of the scrotum. No evidence of inguinal hernia. Musculoskeletal:  No edema, cyanosis, or erythema of extremities. Neurologic: Normal speech and language. Face is symmetric. Moving all extremities. No gross focal neurologic deficits are appreciated. Skin: Skin is warm, dry and intact. No rash noted. Psychiatric: Mood and affect are normal. Speech and behavior are normal.  ED Results / Procedures / Treatments   Labs (all labs ordered are listed, but only abnormal results are displayed) Labs Reviewed  CBC - Abnormal; Notable for the following components:      Result Value   WBC 14.3 (*)    Platelets 410 (*)    All other components within normal limits   COMPREHENSIVE METABOLIC PANEL - Abnormal; Notable for the following components:   CO2 20 (*)    Glucose, Bld 239 (*)    BUN 23 (*)    Total Protein 8.6 (*)    All other components within normal limits  URINALYSIS, COMPLETE (UACMP) WITH MICROSCOPIC - Abnormal; Notable for the following components:   Color, Urine YELLOW (*)    APPearance HAZY (*)    Specific Gravity, Urine 1.039 (*)    Glucose, UA 50 (*)    All other components within normal limits  TROPONIN I (HIGH SENSITIVITY) - Abnormal; Notable for the following components:   Troponin I (High Sensitivity) 20 (*)    All other components within normal limits  TROPONIN I (HIGH SENSITIVITY)     EKG  ED ECG REPORT I, Nita Sickle, the attending physician, personally viewed and interpreted this ECG.  Sinus tachycardia with a rate of 128, normal intervals, no ST elevations or depressions.  Unchanged when compared to prior from February 2023  RADIOLOGY I, Nita Sickle, attending MD, have personally viewed and interpreted the images obtained during this visit as below:  CT angio of the abdomen and pelvis is negative  Chest x-ray is neg ___________________________________________________ Interpretation by Radiologist:  CT Angio Abd/Pel W and/or Wo Contrast  Result Date: 06/04/2022 CLINICAL DATA:  Aneurysm, renal or visceral. Chest pain and right groin pain. Tachycardia. EXAM: CTA ABDOMEN AND PELVIS WITHOUT AND WITH CONTRAST TECHNIQUE: Multidetector CT imaging of the abdomen and pelvis was performed using the standard protocol during bolus administration of intravenous contrast. Multiplanar reconstructed images and MIPs were obtained and reviewed to evaluate the vascular anatomy. RADIATION DOSE REDUCTION: This exam was performed according to the departmental dose-optimization program which includes automated exposure control, adjustment of the mA and/or kV according to patient size and/or use of iterative reconstruction  technique. CONTRAST:  OMNIPAQUE IOHEXOL 350 MG/ML SOLN COMPARISON:  07/29/2016 FINDINGS: VASCULAR Aorta: Normal caliber aorta without aneurysm, dissection, vasculitis or significant stenosis. Celiac: Patent without evidence of aneurysm, dissection, vasculitis or significant stenosis. SMA: Patent without evidence of aneurysm, dissection, vasculitis or significant stenosis. Renals: Both renal arteries are patent without evidence of aneurysm, dissection, vasculitis, fibromuscular dysplasia or significant stenosis. IMA: Patent without evidence of aneurysm, dissection, vasculitis or significant stenosis. Inflow: Patent without evidence of aneurysm, dissection, vasculitis or significant stenosis. Proximal Outflow: Bilateral common femoral and visualized portions of the superficial and profunda femoral arteries are patent without evidence of aneurysm, dissection, vasculitis or significant stenosis. Veins: No obvious  venous abnormality within the limitations of this arterial phase study. Review of the MIP images confirms the above findings. NON-VASCULAR Lower chest: Lung bases are clear. Hepatobiliary: No focal liver abnormality is seen. Status post cholecystectomy. No biliary dilatation. Pancreas: Unremarkable. No pancreatic ductal dilatation or surrounding inflammatory changes. Spleen: Normal in size without focal abnormality. Adrenals/Urinary Tract: Adrenal glands are unremarkable. Kidneys are normal, without renal calculi, focal lesion, or hydronephrosis. Bladder is unremarkable. Stomach/Bowel: Stomach is within normal limits. Appendix appears normal. No evidence of bowel wall thickening, distention, or inflammatory changes. Lymphatic: No significant lymphadenopathy Reproductive: Prostate is unremarkable. Other: No abdominal wall hernia or abnormality. No abdominopelvic ascites. Musculoskeletal: No acute or significant osseous findings. IMPRESSION: VASCULAR The abdominal aorta and major branch vessels are widely  patent without evidence of significant stenosis, occlusion, or aneurysm. NON-VASCULAR No acute abnormalities demonstrated in the abdomen or pelvis. Electronically Signed   By: Burman Nieves M.D.   On: 06/04/2022 00:25   DG Chest 1 View  Result Date: 06/03/2022 CLINICAL DATA:  Chest pain. EXAM: CHEST  1 VIEW COMPARISON:  Chest radiograph dated 02/13/2022. FINDINGS: The heart size and mediastinal contours are within normal limits. Both lungs are clear. The visualized skeletal structures are unremarkable. IMPRESSION: No active disease. Electronically Signed   By: Elgie Collard M.D.   On: 06/03/2022 22:46       PROCEDURES:  Critical Care performed: No  Procedures    IMPRESSION / MDM / ASSESSMENT AND PLAN / ED COURSE  I reviewed the triage vital signs and the nursing notes.  46 y.o. male with a history of diabetes, hypertension, hyperlipidemia who presents for evaluation of right-sided groin pain radiating into his thigh x1 week.  Pain became severe today reports some chest heaviness in the setting of elevated blood pressure.  On exam patient is well-appearing, hypertensive and tachycardic but review of his chart seems that patient always lives with the same vitals and prior doctor visits.  Examination of the groin shows no abnormalities, the pain is reproducible on palpation over the R groin area over the femoral vein/artery location with normal palpable pulse, no redness, no swelling, no bruit. there is no signs of hernia, testicular torsion, scrotum tenderness, swelling or infection, abdomen is soft with no tenderness, there is no rash, there is no asymmetric leg swelling.  He has strong equal pulses in both feet.  He does report that the pain feels like prior shingles however there is no rash on exam at this time  Ddx: shingles, hernia, kidney stone, pyelo, UTI, appendicitis, dissection   Plan: UA, CBC, CMP, CT angio of the abdomen pelvis.  Since patient has some chest discomfort due to  the pain I will get an EKG, troponin x2, and a chest x-ray.  We will give IV fentanyl, Zofran for symptom relief.  We will give his home meds.  Patient placed on telemetry for monitoring of hemodynamics.   MEDICATIONS GIVEN IN ED: Medications  morphine (PF) 4 MG/ML injection 4 mg (4 mg Intravenous Given 06/03/22 2245)  ondansetron (ZOFRAN) injection 4 mg (4 mg Intravenous Given 06/03/22 2245)  fentaNYL (SUBLIMAZE) injection 50 mcg (50 mcg Intravenous Given 06/04/22 0038)  ondansetron (ZOFRAN) injection 4 mg (4 mg Intravenous Given 06/04/22 0038)  iohexol (OMNIPAQUE) 350 MG/ML injection 100 mL (100 mLs Intravenous Contrast Given 06/03/22 2351)  metoprolol succinate (TOPROL-XL) 24 hr tablet 25 mg (25 mg Oral Given 06/04/22 0122)  losartan (COZAAR) tablet 25 mg (25 mg Oral Given 06/04/22 0122)  verapamil (  CALAN-SR) CR tablet 240 mg (240 mg Oral Given 06/04/22 0122)     ED COURSE: CT is completely unremarkable with no acute findings.  UA with no signs of UTI or blood.  He does have a mildly elevated white count that is nonspecific.  Metabolic panel was within patient's baseline with no acute abnormalities.  EKG showing sinus tachycardia which is unchanged when compared to prior but no signs of ischemia.  The first troponin was borderline at 20 with a repeat at 17.  Is unclear the etiology of patient's pain at this time but with a negative CT and blood work I feel the patient is stable for discharge home.  Since the pain is reproducible on palpation it could be muscular skeletal in nature.  We will give him a short course of Percocet and recommended close follow-up with his primary care doctor.  Discussed my standard return precautions.   Consults: none   EMR reviewed including records from his last admission to the hospital from February 2023 for chest pain and diabetes.    FINAL CLINICAL IMPRESSION(S) / ED DIAGNOSES   Final diagnoses:  Abdominal pain, unspecified abdominal location     Rx / DC  Orders   ED Discharge Orders          Ordered    oxyCODONE-acetaminophen (PERCOCET) 5-325 MG tablet  Every 4 hours PRN        06/04/22 0203             Note:  This document was prepared using Dragon voice recognition software and may include unintentional dictation errors.   Please note:  Patient was evaluated in Emergency Department today for the symptoms described in the history of present illness. Patient was evaluated in the context of the global COVID-19 pandemic, which necessitated consideration that the patient might be at risk for infection with the SARS-CoV-2 virus that causes COVID-19. Institutional protocols and algorithms that pertain to the evaluation of patients at risk for COVID-19 are in a state of rapid change based on information released by regulatory bodies including the CDC and federal and state organizations. These policies and algorithms were followed during the patient's care in the ED.  Some ED evaluations and interventions may be delayed as a result of limited staffing during the pandemic.       Don Perking, [place], MD 06/04/22 [phone]

## 2022-06-04 LAB — URINALYSIS, COMPLETE (UACMP) WITH MICROSCOPIC
Bacteria, UA: NONE SEEN
Bilirubin Urine: NEGATIVE
Glucose, UA: 50 mg/dL — AB
Hgb urine dipstick: NEGATIVE
Ketones, ur: NEGATIVE mg/dL
Leukocytes,Ua: NEGATIVE
Nitrite: NEGATIVE
Protein, ur: NEGATIVE mg/dL
Specific Gravity, Urine: 1.039 — ABNORMAL HIGH (ref 1.005–1.030)
pH: 6 (ref 5.0–8.0)

## 2022-06-04 LAB — TROPONIN I (HIGH SENSITIVITY): Troponin I (High Sensitivity): 17 ng/L (ref <18)

## 2022-06-04 MED ORDER — OXYCODONE-ACETAMINOPHEN 5-325 MG PO TABS: 1.0000 | ORAL_TABLET | ORAL | 0 refills | Status: AC | PRN

## 2022-06-04 MED ORDER — LOSARTAN POTASSIUM 50 MG PO TABS
25.0000 mg | ORAL_TABLET | Freq: Once | ORAL | Status: AC
  Administered 2022-06-04: 25 mg via ORAL
  Filled 2022-06-04: qty 1

## 2022-06-04 MED ORDER — METOPROLOL SUCCINATE ER 50 MG PO TB24
25.0000 mg | ORAL_TABLET | Freq: Once | ORAL | Status: AC
  Administered 2022-06-04: 25 mg via ORAL
  Filled 2022-06-04: qty 1

## 2022-06-04 MED ORDER — VERAPAMIL HCL ER 240 MG PO TBCR
240.0000 mg | EXTENDED_RELEASE_TABLET | Freq: Once | ORAL | Status: AC
Start: 2022-06-04 — End: 2022-06-04
  Administered 2022-06-04: 240 mg via ORAL
  Filled 2022-06-04: qty 1

## 2022-07-31 ENCOUNTER — Emergency Department
Admission: EM | Admit: 2022-07-31 | Discharge: 2022-07-31 | Disposition: A | Discharge status: Home / Self Care | Payer: Worker's Compensation | Attending: Emergency Medicine | Admitting: Emergency Medicine

## 2022-07-31 ENCOUNTER — Emergency Department: Payer: Worker's Compensation

## 2022-07-31 ENCOUNTER — Other Ambulatory Visit: Payer: Self-pay

## 2022-07-31 DIAGNOSIS — E119 Type 2 diabetes mellitus without complications: Secondary | ICD-10-CM | POA: Diagnosis not present

## 2022-07-31 DIAGNOSIS — R0789 Other chest pain: Secondary | ICD-10-CM | POA: Diagnosis not present

## 2022-07-31 DIAGNOSIS — M79672 Pain in left foot: Secondary | ICD-10-CM | POA: Insufficient documentation

## 2022-07-31 DIAGNOSIS — R000 Tachycardia, unspecified: Secondary | ICD-10-CM | POA: Insufficient documentation

## 2022-07-31 DIAGNOSIS — Y990 Civilian activity done for income or pay: Secondary | ICD-10-CM | POA: Insufficient documentation

## 2022-07-31 DIAGNOSIS — W230XXA Caught, crushed, jammed, or pinched between moving objects, initial encounter: Secondary | ICD-10-CM | POA: Diagnosis not present

## 2022-07-31 DIAGNOSIS — M7918 Myalgia, other site: Secondary | ICD-10-CM

## 2022-07-31 DIAGNOSIS — R519 Headache, unspecified: Secondary | ICD-10-CM | POA: Insufficient documentation

## 2022-07-31 DIAGNOSIS — I10 Essential (primary) hypertension: Secondary | ICD-10-CM | POA: Insufficient documentation

## 2022-07-31 LAB — COMPREHENSIVE METABOLIC PANEL
ALT: 23 U/L (ref 0–44)
AST: 20 U/L (ref 15–41)
Albumin: 4.4 g/dL (ref 3.5–5.0)
Alkaline Phosphatase: 97 U/L (ref 38–126)
Anion gap: 12 (ref 5–15)
BUN: 35 mg/dL — ABNORMAL HIGH (ref 6–20)
CO2: 21 mmol/L — ABNORMAL LOW (ref 22–32)
Calcium: 9.2 mg/dL (ref 8.9–10.3)
Chloride: 100 mmol/L (ref 98–111)
Creatinine, Ser: 1.22 mg/dL (ref 0.61–1.24)
GFR, Estimated: >60 mL/min (ref >60)
Glucose, Bld: 219 mg/dL — ABNORMAL HIGH (ref 70–99)
Potassium: 3.7 mmol/L (ref 3.5–5.1)
Sodium: 133 mmol/L — ABNORMAL LOW (ref 135–145)
Total Bilirubin: 0.6 mg/dL (ref 0.3–1.2)
Total Protein: 8.2 g/dL — ABNORMAL HIGH (ref 6.5–8.1)

## 2022-07-31 LAB — CBC
HCT: 40.8 % (ref 39.0–52.0)
Hemoglobin: 13.6 g/dL (ref 13.0–17.0)
MCH: 28.2 pg (ref 26.0–34.0)
MCHC: 33.3 g/dL (ref 30.0–36.0)
MCV: 84.6 fL (ref 80.0–100.0)
Platelets: 362 10*3/uL (ref 150–400)
RBC: 4.82 MIL/uL (ref 4.22–5.81)
RDW: 13.7 % (ref 11.5–15.5)
WBC: 10.7 10*3/uL — ABNORMAL HIGH (ref 4.0–10.5)
nRBC: 0 % (ref 0.0–0.2)

## 2022-07-31 LAB — TROPONIN I (HIGH SENSITIVITY): Troponin I (High Sensitivity): 7 ng/L (ref <18)

## 2022-07-31 MED ORDER — CYCLOBENZAPRINE HCL 5 MG PO TABS: 5.0000 mg | ORAL_TABLET | Freq: Three times a day (TID) | ORAL | 0 refills | Status: AC | PRN

## 2022-07-31 NOTE — ED Triage Notes (Signed)
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  Pt states he is having pain in medial area of left foot/ankle and states he is having some numbness in his toes.  Pt was wearing steel toed boots.  Pt has good pulses noted in foot.  Pt also states after his foot was run over, he fell forward onto a wooden palate and is having pain to right chest wall where he landed.  Pt is A&O x4 at this time in NAD in triage.

## 2022-07-31 NOTE — ED Provider Notes (Signed)
[facility] Provider Note    Event Date/Time   First MD Initiated Contact with Patient 07/31/22 0422     (approximate)  History   Chief Complaint: Foot Injury and Chest Pain  HPI  Nathan Garner is a 46 y.o. male with a past medical history of diabetes, hypertension, hyperlipidemia presents to the emergency department for evaluation after an injury at work.  According to the patient a new employee accidentally ran his left foot over with a pallet jack.  Patient was wearing steel toed boots.  However right after he ran the foot over he lost his balance falling forwards into a stack of pallets.  Patient states initially he was having some chest wall discomfort although states that has since gone away.  Continues to have pain in the left foot he describes more as a burning type pain.  Physical Exam   Triage Vital Signs: ED Triage Vitals  Enc Vitals Group     BP 07/31/22 0200 119/82     Pulse Rate 07/31/22 0200 (!) 120     Resp 07/31/22 0200 16     Temp 07/31/22 0200 98.3 F (36.8 C)     Temp Source 07/31/22 0200 Oral     SpO2 07/31/22 0200 98 %     Weight 07/31/22 0201 252 lb (114.3 kg)     Height 07/31/22 0201 6' (1.829 m)     Head Circumference --      Peak Flow --      Pain Score 07/31/22 0200 8     Pain Loc --      Pain Edu? --      Excl. in GC? --     Most recent vital signs: Vitals:   07/31/22 0200 07/31/22 0407  BP: 119/82 (!) 159/88  Pulse: (!) 120 (!) 115  Resp: 16 20  Temp: 98.3 F (36.8 C)   SpO2: 98% 99%    General: Awake, no distress.  CV:  Good peripheral perfusion.  Regular rate and rhythm  Resp:  Normal effort.  Equal breath sounds bilaterally.  Chest wall is nontender to palpation Abd:  No distention.  Soft, nontender.  No rebound or guarding. Other:  2+ DP pulse with warm toes with good cap refill.  Good range of motion.  No obvious edema noted.   ED Results / Procedures / Treatments   EKG  EKG viewed and interpreted  by myself shows a sinus tachycardia at 119 bpm with a narrow QRS, normal axis, normal intervals, nonspecific ST changes but no ST elevation.  RADIOLOGY  I reviewed the x-ray of foot images.  I do not see any fracture on my interpretation of the images. Radiology is read the x-rays of the foot and ankle is negative.  Chest x-ray is normal.  CT scan head is negative.   MEDICATIONS ORDERED IN ED: Medications - No data to display   IMPRESSION / MDM / ASSESSMENT AND PLAN / ED COURSE  I reviewed the triage vital signs and the nursing notes.  Patient's presentation is most consistent with acute illness / injury with system symptoms.  Patient presents emergency department after having his left foot run over by an electric pallet jack causing him to fall forwards hitting his chest on pallets.  Overall the patient appears well, no distress.  Patient states he is feeling better but still has some pain in his ankle foot although states the chest pain has since gone away.  Describes more as  generalized muscle aches.  Patient's work-up in the emergency department is reassuring.  CT scan of the head is normal.  X-rays are negative for acute fracture.  EKG shows no concerning/acute findings.  Chemistry is normal CBC normal and troponin negative.  Given the patient's reassuring work-up I believe the patient is safe for discharge home.  Suspect more musculoskeletal discomfort we will prescribe a short course of Flexeril have the patient follow-up with his doctor.  Discussed return precautions.  Patient agreeable to plan.  FINAL CLINICAL IMPRESSION(S) / ED DIAGNOSES    Musculoskeletal pain  Note:  This document was prepared using Dragon voice recognition software and may include unintentional dictation errors.   Minna Antis, MD 07/31/22 [phone]

## 2022-09-17 ENCOUNTER — Encounter: Payer: Self-pay | Admitting: Podiatry

## 2022-09-17 ENCOUNTER — Ambulatory Visit (INDEPENDENT_AMBULATORY_CARE_PROVIDER_SITE_OTHER): Payer: 59

## 2022-09-17 ENCOUNTER — Ambulatory Visit (INDEPENDENT_AMBULATORY_CARE_PROVIDER_SITE_OTHER): Payer: Worker's Compensation | Admitting: Podiatry

## 2022-09-17 DIAGNOSIS — M779 Enthesopathy, unspecified: Secondary | ICD-10-CM

## 2022-09-17 DIAGNOSIS — S8490XA Injury of unspecified nerve at lower leg level, unspecified leg, initial encounter: Secondary | ICD-10-CM

## 2022-09-17 DIAGNOSIS — M778 Other enthesopathies, not elsewhere classified: Secondary | ICD-10-CM

## 2022-09-17 DIAGNOSIS — S93402A Sprain of unspecified ligament of left ankle, initial encounter: Secondary | ICD-10-CM | POA: Diagnosis not present

## 2022-09-19 ENCOUNTER — Ambulatory Visit: Payer: 59

## 2022-09-20 NOTE — Progress Notes (Signed)
  Subjective:  Patient ID: Nathan Garner, male    DOB: [birth year],  MRN: [id number]  Chief Complaint  Patient presents with   Foot Pain    "Power jack ran into the side of my left foot." N - side of my left foot hurts L - medial left  D - August 15 O - suddenly, gotten worse C - inflammed, shooting pain A - sitting awhile, walk long period of time T - Tramadol, ice, heat,     46 y.o. male presents with the above complaint. History confirmed with patient.  This is an injury that happened on 15 August while at work the side of a Pensions consultant impacted the left foot and then he fell to ground rolling his ankle inverted.  He had immediate pain and inflammation.  He was evaluated at the emergency room and x-rays were taken which were negative for fracture.  He then went to urgent care and was placed into a walking boot.  He notes tingling shooting pain through the inside of the foot as well as pain on the outside of the ankle  Objective:  Physical Exam: warm, good capillary refill, no trophic changes or ulcerative lesions, normal DP and PT pulses, normal sensory exam, and his left foot has pain along the saphenous nerve into the midfoot he also has pain over the ATFL and CFL, there is no gross instability, as well as the sinus tarsi, there is good 5 out of 5 strength of the PT tendon and inversion   Radiographs: Multiple views x-ray of left foot and ankle: New films were taken today and compared to the films on 07/31/2022 no acute fracture or dislocation noted no significant degenerative changes are noted either Assessment:   1. Neuropraxia of lower extremity   2. Severe sprain of left ankle, initial encounter      Plan:  Patient was evaluated and treated and all questions answered.  We reviewed his radiographs and my clinical exam findings as well as his progress.  He has had CAM boot immobilization for approximately 3 weeks.  He appears to have 2 separate issues that are both related to  the injury.  Initially was an impact injury of the pallet jack into his foot causing a likely neuropraxia of the medial dorsal cutaneous nerve, less likely direct injury to the posterior tibial tendon which was intact and had good strength today.  Secondarily as an inversion injury of the left lateral ankle with pain over the ATFL and CFL both of these were intact and no gross instability or tendon tear was readily noted on physical exam today.  We discussed treatment of an inversion ankle injury and he has a some immobilization for the last 3 weeks.  I recommend he continue his CAM boot immobilization for an additional 4 weeks.  We discussed the possibility of an MRI to evaluate the soft tissues further, if he is not improving within 4 weeks then we will plan for MRI.  In the interim he will also begin physical therapy and referral for [facility] physical therapy was given to them to schedule in [facility].  Return in about 6 weeks (around 10/29/2022) for follow up left ankle injury ([facility]).

## 2022-09-26 ENCOUNTER — Telehealth: Payer: Self-pay | Admitting: Podiatry

## 2022-09-26 NOTE — Telephone Encounter (Signed)
Workman's Comp Rep is calling for information regarding patients last office visit. Need to know diagnosis codes and provider information.  Please advise

## 2022-09-27 NOTE — Telephone Encounter (Signed)
Left a voicemail with Otila Kluver for her to call back with her fax number so I can send over the [place] office visit notes. The notes have the Dx codes and provider information on there.

## 2022-09-27 NOTE — Telephone Encounter (Signed)
Office notes were faxed over to Anna at [place] at [phone]

## 2022-09-27 NOTE — Telephone Encounter (Signed)
Attempted several times to fax over information to [place] at [phone] but the line is busy, so the fax is not going through. I left a voicemail for Tiana to call back with an alternate fax number.

## 2022-10-20 IMAGING — CR DG CHEST 2V
2 series · 2 of 2 positions shown · non-contrast
Comparison: Chest radiograph dated 11/17/2021.

CLINICAL DATA: Chest pain

EXAM:
CHEST - 2 VIEW

[chest pa]
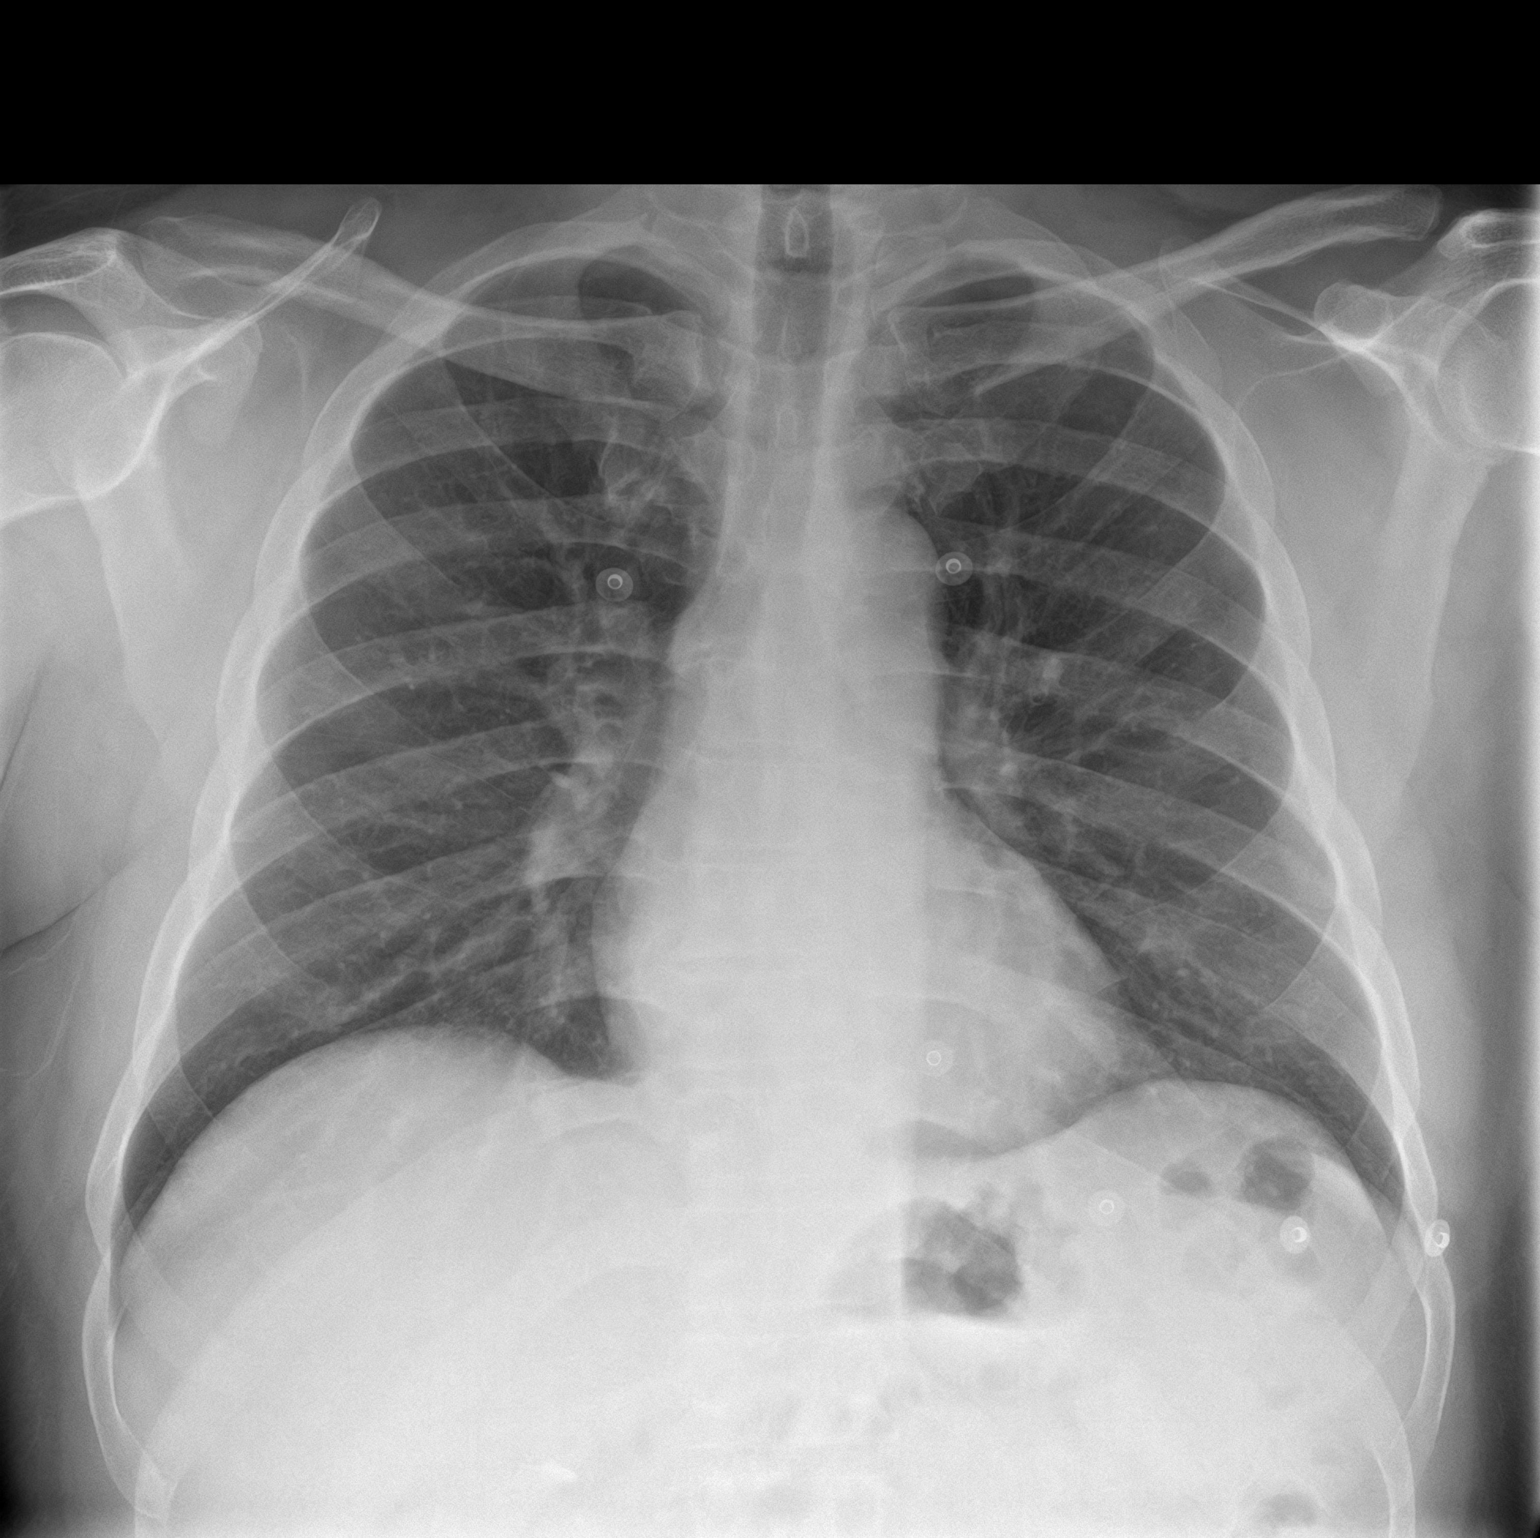

[chest lat]
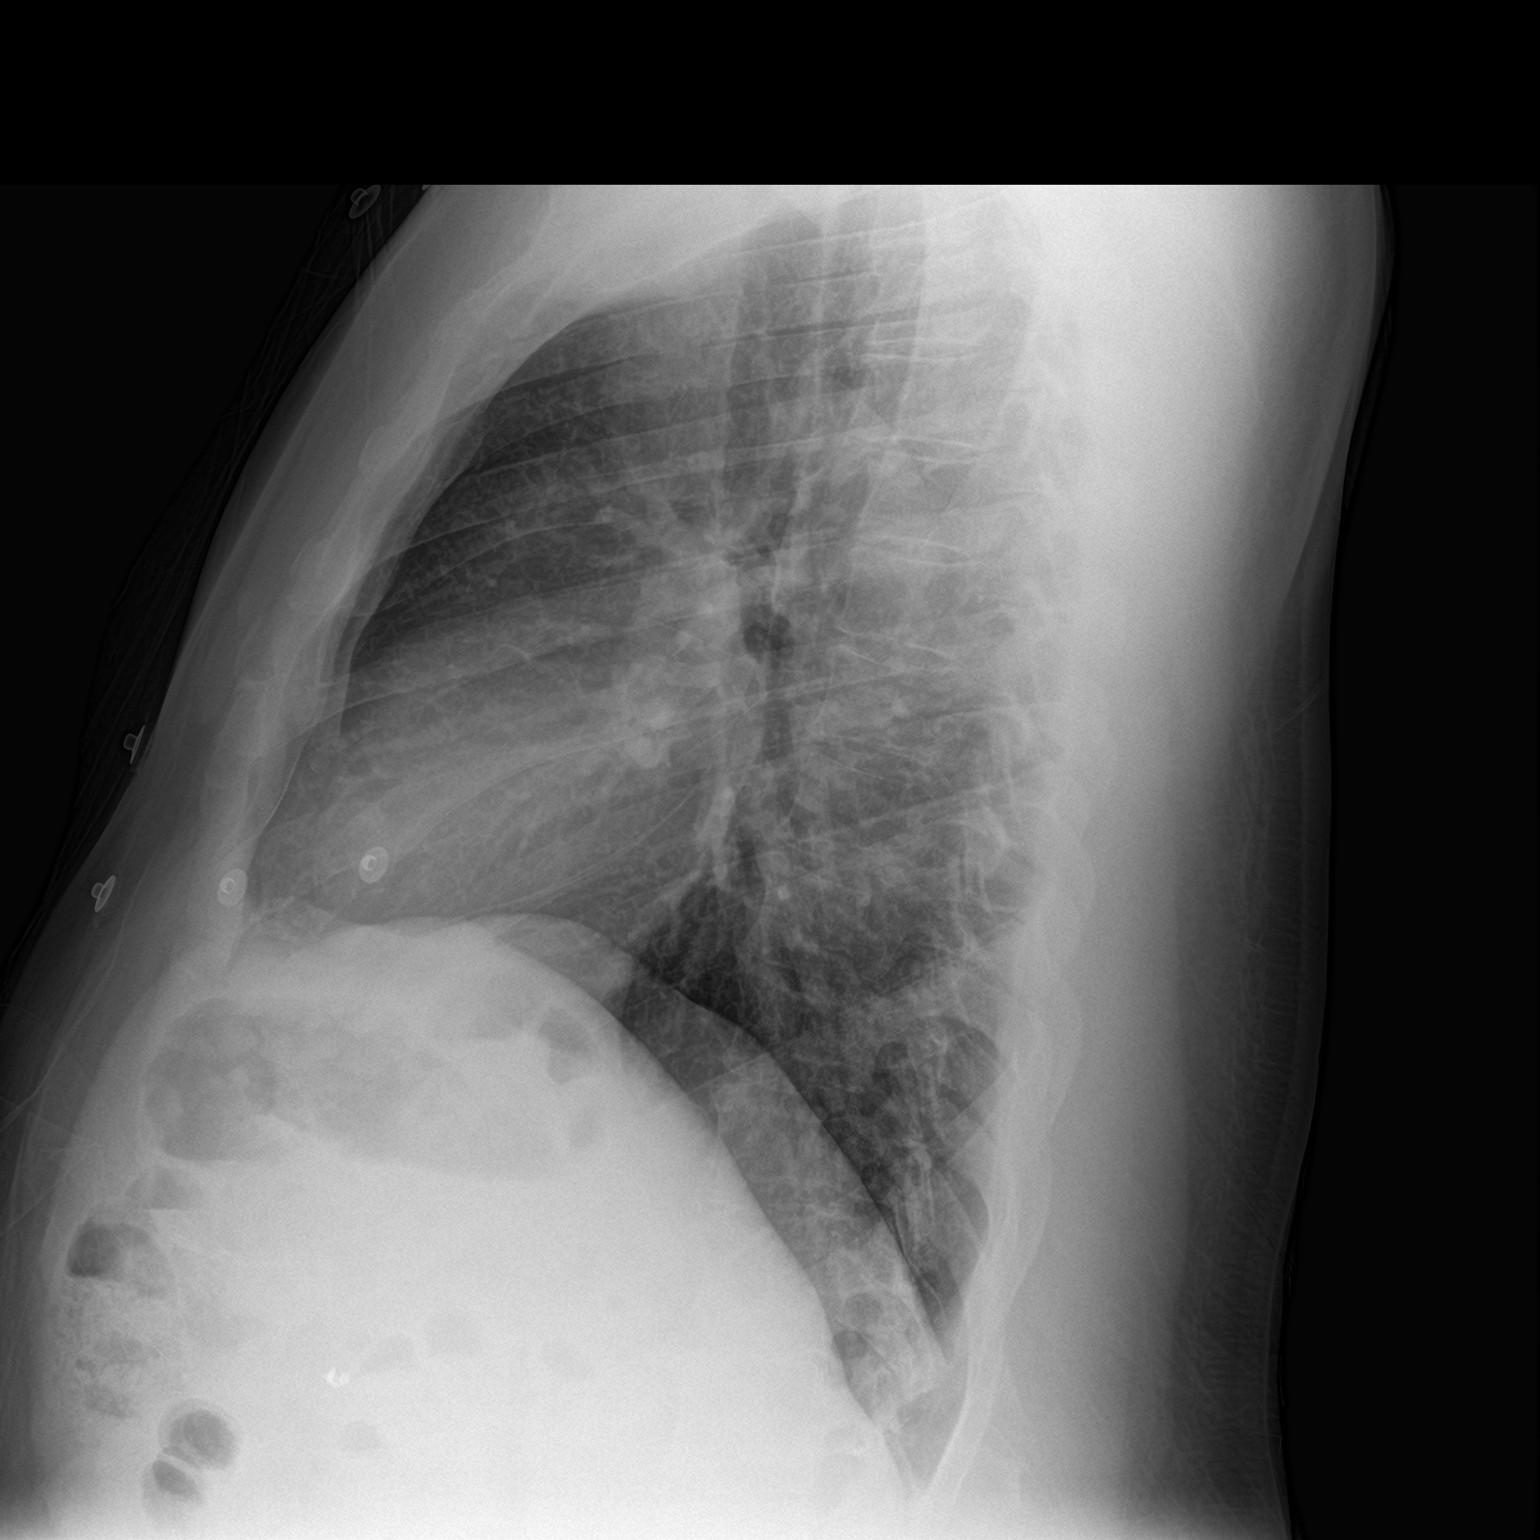

[2 of 2 positions shown; findings below may reference images not displayed]

FINDINGS: The heart size and mediastinal contours are within normal limits.
Both lungs are clear. The visualized skeletal structures are
unremarkable.
IMPRESSION: No active cardiopulmonary disease.

## 2022-10-29 ENCOUNTER — Encounter: Payer: Self-pay | Admitting: *Deleted

## 2022-10-29 ENCOUNTER — Ambulatory Visit (INDEPENDENT_AMBULATORY_CARE_PROVIDER_SITE_OTHER): Payer: Worker's Compensation | Admitting: Podiatry

## 2022-10-29 DIAGNOSIS — S8490XA Injury of unspecified nerve at lower leg level, unspecified leg, initial encounter: Secondary | ICD-10-CM | POA: Diagnosis not present

## 2022-10-29 DIAGNOSIS — S93402A Sprain of unspecified ligament of left ankle, initial encounter: Secondary | ICD-10-CM | POA: Diagnosis not present

## 2022-10-29 NOTE — Progress Notes (Signed)
  Subjective:  Patient ID: Nathan Garner, male    DOB: [birth year],  MRN: [id number]  Chief Complaint  Patient presents with   neuropraxia    Follow up Workmans comp-left ankle    46 y.o. male presents with the above complaint. History confirmed with patient.  He says that is doing much better is no longer really having much pain at all now.  Objective:  Physical Exam: warm, good capillary refill, no trophic changes or ulcerative lesions, normal DP and PT pulses, normal sensory exam, and left foot has no pain today along the saphenous nerve, good range of motion 5 out of 5 strength in all planes no pain over the lateral ankle ligaments   Radiographs: Multiple views x-ray of left foot and ankle: New films were taken today and compared to the films on 07/31/2022 no acute fracture or dislocation noted no significant degenerative changes are noted either Assessment:   1. Neuropraxia of lower extremity   2. Severe sprain of left ankle, initial encounter      Plan:  Patient was evaluated and treated and all questions answered.  Overall doing much better.  He was not able to do physical therapy but it is resolved regardless.  He may return to work full-time without activity or restriction at this point.  Return to see me as needed if this does not improve.  Return if symptoms worsen or fail to improve.

## 2023-01-21 ENCOUNTER — Encounter: Payer: Self-pay | Admitting: Emergency Medicine

## 2023-01-21 ENCOUNTER — Emergency Department: Payer: 59

## 2023-01-21 DIAGNOSIS — R000 Tachycardia, unspecified: Secondary | ICD-10-CM | POA: Insufficient documentation

## 2023-01-21 DIAGNOSIS — R791 Abnormal coagulation profile: Secondary | ICD-10-CM | POA: Diagnosis not present

## 2023-01-21 DIAGNOSIS — R1110 Vomiting, unspecified: Secondary | ICD-10-CM | POA: Diagnosis not present

## 2023-01-21 DIAGNOSIS — R531 Weakness: Secondary | ICD-10-CM | POA: Diagnosis not present

## 2023-01-21 DIAGNOSIS — R42 Dizziness and giddiness: Secondary | ICD-10-CM | POA: Insufficient documentation

## 2023-01-21 DIAGNOSIS — Z79899 Other long term (current) drug therapy: Secondary | ICD-10-CM | POA: Diagnosis not present

## 2023-01-21 DIAGNOSIS — Z794 Long term (current) use of insulin: Secondary | ICD-10-CM | POA: Insufficient documentation

## 2023-01-21 DIAGNOSIS — I10 Essential (primary) hypertension: Secondary | ICD-10-CM | POA: Diagnosis not present

## 2023-01-21 DIAGNOSIS — R059 Cough, unspecified: Secondary | ICD-10-CM | POA: Insufficient documentation

## 2023-01-21 DIAGNOSIS — R079 Chest pain, unspecified: Secondary | ICD-10-CM | POA: Insufficient documentation

## 2023-01-21 DIAGNOSIS — R197 Diarrhea, unspecified: Secondary | ICD-10-CM | POA: Insufficient documentation

## 2023-01-21 DIAGNOSIS — Z7984 Long term (current) use of oral hypoglycemic drugs: Secondary | ICD-10-CM | POA: Insufficient documentation

## 2023-01-21 DIAGNOSIS — E119 Type 2 diabetes mellitus without complications: Secondary | ICD-10-CM | POA: Insufficient documentation

## 2023-01-21 DIAGNOSIS — Z20822 Contact with and (suspected) exposure to covid-19: Secondary | ICD-10-CM | POA: Insufficient documentation

## 2023-01-21 DIAGNOSIS — R200 Anesthesia of skin: Secondary | ICD-10-CM | POA: Insufficient documentation

## 2023-01-21 DIAGNOSIS — R7989 Other specified abnormal findings of blood chemistry: Secondary | ICD-10-CM | POA: Diagnosis not present

## 2023-01-21 DIAGNOSIS — Z7982 Long term (current) use of aspirin: Secondary | ICD-10-CM | POA: Insufficient documentation

## 2023-01-21 DIAGNOSIS — R0602 Shortness of breath: Secondary | ICD-10-CM | POA: Diagnosis not present

## 2023-01-21 DIAGNOSIS — R509 Fever, unspecified: Secondary | ICD-10-CM | POA: Insufficient documentation

## 2023-01-21 DIAGNOSIS — R519 Headache, unspecified: Secondary | ICD-10-CM | POA: Insufficient documentation

## 2023-01-21 LAB — CBC
HCT: 37.2 % — ABNORMAL LOW (ref 39.0–52.0)
Hemoglobin: 12.2 g/dL — ABNORMAL LOW (ref 13.0–17.0)
MCH: 27.7 pg (ref 26.0–34.0)
MCHC: 32.8 g/dL (ref 30.0–36.0)
MCV: 84.4 fL (ref 80.0–100.0)
Platelets: 285 10*3/uL (ref 150–400)
RBC: 4.41 MIL/uL (ref 4.22–5.81)
RDW: 13.4 % (ref 11.5–15.5)
WBC: 6.5 10*3/uL (ref 4.0–10.5)
nRBC: 0 % (ref 0.0–0.2)

## 2023-01-21 NOTE — ED Triage Notes (Addendum)
Pt presents via POV with complaints of dizziness for the last 2 weeks with associated headache, cough, and dysphagia. The dysphagia has resolved but the dizziness has been constant with movement. Pt states that he has had some issues with gait started several weeks ago - mainly his left side. Of note, pt has most of his care at [facility] Denies CP or SOB.

## 2023-01-22 ENCOUNTER — Emergency Department: Payer: 59

## 2023-01-22 ENCOUNTER — Emergency Department
Admission: EM | Admit: 2023-01-22 | Discharge: 2023-01-22 | Disposition: A | Discharge status: Home / Self Care | Payer: 59 | Attending: Emergency Medicine | Admitting: Emergency Medicine

## 2023-01-22 DIAGNOSIS — R42 Dizziness and giddiness: Secondary | ICD-10-CM

## 2023-01-22 DIAGNOSIS — R079 Chest pain, unspecified: Secondary | ICD-10-CM

## 2023-01-22 LAB — URINALYSIS, ROUTINE W REFLEX MICROSCOPIC
Bacteria, UA: NONE SEEN
Bilirubin Urine: NEGATIVE
Glucose, UA: >=500 mg/dL — AB
Hgb urine dipstick: NEGATIVE
Ketones, ur: NEGATIVE mg/dL
Leukocytes,Ua: NEGATIVE
Nitrite: NEGATIVE
Protein, ur: NEGATIVE mg/dL
Specific Gravity, Urine: 1.018 (ref 1.005–1.030)
pH: 5 (ref 5.0–8.0)

## 2023-01-22 LAB — TROPONIN I (HIGH SENSITIVITY)
Troponin I (High Sensitivity): 18 ng/L — ABNORMAL HIGH (ref <18)
Troponin I (High Sensitivity): 23 ng/L — ABNORMAL HIGH (ref <18)

## 2023-01-22 LAB — BASIC METABOLIC PANEL
Anion gap: 13 (ref 5–15)
BUN: 24 mg/dL — ABNORMAL HIGH (ref 6–20)
CO2: 22 mmol/L (ref 22–32)
Calcium: 8.6 mg/dL — ABNORMAL LOW (ref 8.9–10.3)
Chloride: 98 mmol/L (ref 98–111)
Creatinine, Ser: 1.16 mg/dL (ref 0.61–1.24)
GFR, Estimated: >60 mL/min (ref >60)
Glucose, Bld: 224 mg/dL — ABNORMAL HIGH (ref 70–99)
Potassium: 4.1 mmol/L (ref 3.5–5.1)
Sodium: 133 mmol/L — ABNORMAL LOW (ref 135–145)

## 2023-01-22 LAB — PROTIME-INR
INR: 1 (ref 0.8–1.2)
Prothrombin Time: 13.3 seconds (ref 11.4–15.2)

## 2023-01-22 LAB — URINE DRUG SCREEN, QUALITATIVE (ARMC ONLY)
Amphetamines, Ur Screen: NOT DETECTED
Barbiturates, Ur Screen: NOT DETECTED
Benzodiazepine, Ur Scrn: NOT DETECTED
Cannabinoid 50 Ng, Ur ~~LOC~~: NOT DETECTED
Cocaine Metabolite,Ur ~~LOC~~: NOT DETECTED
MDMA (Ecstasy)Ur Screen: NOT DETECTED
Methadone Scn, Ur: NOT DETECTED
Opiate, Ur Screen: NOT DETECTED
Phencyclidine (PCP) Ur S: NOT DETECTED
Tricyclic, Ur Screen: NOT DETECTED

## 2023-01-22 LAB — RESP PANEL BY RT-PCR (RSV, FLU A&B, COVID)  RVPGX2
Influenza A by PCR: NEGATIVE
Influenza B by PCR: NEGATIVE
Resp Syncytial Virus by PCR: NEGATIVE
SARS Coronavirus 2 by RT PCR: NEGATIVE

## 2023-01-22 LAB — D-DIMER, QUANTITATIVE: D-Dimer, Quant: 0.78 ug/mL-FEU — ABNORMAL HIGH (ref 0.00–0.50)

## 2023-01-22 MED ORDER — IOHEXOL 350 MG/ML SOLN
75.0000 mL | Freq: Once | INTRAVENOUS | Status: AC | PRN
  Administered 2023-01-22: 75 mL via INTRAVENOUS

## 2023-01-22 MED ORDER — MECLIZINE HCL 25 MG PO TABS: 25.0000 mg | ORAL_TABLET | Freq: Three times a day (TID) | ORAL | 0 refills | Status: AC | PRN

## 2023-01-22 MED ORDER — MECLIZINE HCL 25 MG PO TABS
25.0000 mg | ORAL_TABLET | Freq: Once | ORAL | Status: AC
  Administered 2023-01-22: 25 mg via ORAL
  Filled 2023-01-22: qty 1

## 2023-01-22 MED ORDER — SODIUM CHLORIDE 0.9 % IV BOLUS (SEPSIS)
1000.0000 mL | Freq: Once | INTRAVENOUS | Status: AC
  Administered 2023-01-22: 1000 mL via INTRAVENOUS

## 2023-01-22 MED ORDER — ONDANSETRON HCL 4 MG/2ML IJ SOLN
4.0000 mg | Freq: Once | INTRAMUSCULAR | Status: AC
  Administered 2023-01-22: 4 mg via INTRAVENOUS
  Filled 2023-01-22: qty 2

## 2023-01-22 MED ORDER — KETOROLAC TROMETHAMINE 30 MG/ML IJ SOLN
30.0000 mg | Freq: Once | INTRAMUSCULAR | Status: AC
  Administered 2023-01-22: 30 mg via INTRAVENOUS
  Filled 2023-01-22: qty 1

## 2023-01-22 NOTE — ED Notes (Signed)
Pt to MRI

## 2023-01-22 NOTE — ED Provider Notes (Signed)
[facility] Provider Note    Event Date/Time   First MD Initiated Contact with Patient 01/22/23 [phone]     (approximate)   History   Dizziness   HPI  Nathan Garner is a 47 y.o. male history of hypertension, diabetes, hyperlipidemia who presents to the emergency department with 2 weeks worth of vision changes where he feels like his "eyes are shaking" left-sided weakness, intermittent numbness.  States he was called by his doctor a week ago and told to come immediately to the emergency department but states he was not given a reason why is that he waited to come until now.  Has had some headaches but none currently.  No head injury.  Not on blood thinners.  States he has some numbness in his right face and left leg.  No known history of stroke.  Did have negative carotid Dopplers as an outpatient at [facility] recently.  No imaging has been done of his brain.   Patient also complaining of chest pain and shortness of breath.  He states he is feeling dizzy and describes this as lightheaded and vertigo.  He has had cough and reports recent fever as well.  States he is also had vomiting and diarrhea.  States he works here in [employer] but lives in [place].  History provided by patient.    Past Medical History:  Diagnosis Date   Diabetes (HCC)    type 2   Hypercholesterolemia    Hypertension     Past Surgical History:  Procedure Laterality Date   abscess removal Bilateral    inner thighs x 7   lap choley  2013    MEDICATIONS:  Prior to Admission medications   Medication Sig Start Date End Date Taking? Authorizing Provider  aspirin 81 MG EC tablet Take by mouth. 05/11/21   [provider]  atorvastatin (LIPITOR) 80 MG tablet Take 1 tablet (80 mg total) by mouth daily. 02/14/22   Lurene Shadow, MD  cyclobenzaprine (FLEXERIL) 5 MG tablet Take 1 tablet (5 mg total) by mouth 3 (three) times daily as needed for muscle spasms. 07/31/22    Minna Antis, MD  famotidine (PEPCID) 40 MG tablet Take by mouth.    [provider]  gabapentin (NEURONTIN) 300 MG capsule Take 300 mg by mouth at bedtime. 11/17/21   [provider]  insulin glargine (LANTUS) 100 UNIT/ML injection Inject 0.6 mLs (60 Units total) into the skin at bedtime. 02/14/22   Lurene Shadow, MD  insulin lispro (HUMALOG) 100 UNIT/ML injection Inject into the skin 3 (three) times daily with meals. Per sliding scale.    [provider]  isosorbide mononitrate (IMDUR) 30 MG 24 hr tablet Take 1 tablet (30 mg total) by mouth daily. 02/15/22   Lurene Shadow, MD  losartan (COZAAR) 25 MG tablet Take 25 mg by mouth daily. 10/31/21   [provider]  metFORMIN (GLUCOPHAGE) 1000 MG tablet Take 1,000 mg by mouth 2 (two) times daily with a meal. 09/01/20   [provider]  metoprolol succinate (TOPROL-XL) 25 MG 24 hr tablet Take 25 mg by mouth daily. 11/08/21   [provider]  oxyCODONE-acetaminophen (PERCOCET) 5-325 MG tablet Take 1 tablet by mouth every 4 (four) hours as needed. 06/04/22   Nita Sickle, MD  verapamil (CALAN-SR) 240 MG CR tablet Take 240 mg by mouth daily.    [provider]    Physical Exam   Triage Vital Signs: ED Triage Vitals [01/21/23  2327]  Enc Vitals Group     BP 124/70     Pulse Rate (!) 102     Resp 18     Temp 98.5 F (36.9 C)     Temp Source Oral     SpO2 98 %     Weight 252 lb 10.4 oz (114.6 kg)     Height 6' (1.829 m)     Head Circumference      Peak Flow      Pain Score 0     Pain Loc      Pain Edu?      Excl. in [place]?     Most recent vital signs: Vitals:   01/22/23 0415 01/22/23 0430  BP:  119/74  Pulse: 93 91  Resp: 17 16  Temp:    SpO2: 99% 98%    CONSTITUTIONAL: Alert, responds appropriately to questions. Well-appearing; well-nourished HEAD: Normocephalic, atraumatic EYES: Conjunctivae clear, pupils appear equal, sclera nonicteric ENT: normal nose; moist  mucous membranes NECK: Supple, normal ROM CARD: RRR; S1 and S2 appreciated RESP: Normal chest excursion without splinting or tachypnea; breath sounds clear and equal bilaterally; no wheezes, no rhonchi, no rales, no hypoxia or respiratory distress, speaking full sentences ABD/GI: Non-distended; soft, non-tender, no rebound, no guarding, no peritoneal signs BACK: The back appears normal EXT: Normal ROM in all joints; no deformity noted, no edema SKIN: Normal color for age and race; warm; no rash on exposed skin NEURO: Moves all extremities equally, normal speech, no drift, no facial asymmetry, reports decreased sensation in the right face and left leg PSYCH: The patient's mood and manner are appropriate.   ED Results / Procedures / Treatments   LABS: (all labs ordered are listed, but only abnormal results are displayed) Labs Reviewed  BASIC METABOLIC PANEL - Abnormal; Notable for the following components:      Result Value   Sodium 133 (*)    Glucose, Bld 224 (*)    BUN 24 (*)    Calcium 8.6 (*)    All other components within normal limits  CBC - Abnormal; Notable for the following components:   Hemoglobin 12.2 (*)    HCT 37.2 (*)    All other components within normal limits  D-DIMER, QUANTITATIVE - Abnormal; Notable for the following components:   D-Dimer, Quant 0.78 (*)    All other components within normal limits  URINALYSIS, ROUTINE W REFLEX MICROSCOPIC - Abnormal; Notable for the following components:   Color, Urine YELLOW (*)    APPearance CLEAR (*)    Glucose, UA >=500 (*)    All other components within normal limits  TROPONIN I (HIGH SENSITIVITY) - Abnormal; Notable for the following components:   Troponin I (High Sensitivity) 23 (*)    All other components within normal limits  TROPONIN I (HIGH SENSITIVITY) - Abnormal; Notable for the following components:   Troponin I (High Sensitivity) 18 (*)    All other components within normal limits  RESP PANEL BY RT-PCR (RSV,  FLU A&B, COVID)  RVPGX2  PROTIME-INR  URINE DRUG SCREEN, QUALITATIVE [facility] ONLY)     EKG:  EKG Interpretation  Date/Time:  Monday January 21 2023 23:27:03 EST Ventricular Rate:  115 PR Interval:  142 QRS Duration: 74 QT Interval:  320 QTC Calculation: 442 R Axis:   44 Text Interpretation: Sinus tachycardia Otherwise normal ECG When compared with ECG of 31-Jul-2022 02:06, Nonspecific T wave abnormality no longer evident in Lateral leads Confirmed by Sears Oran, Cyril Mourning [phone]) on  01/22/2023 1:27:56 AM         RADIOLOGY: My personal review and interpretation of imaging: Chest x-ray clear CTA chest shows no PE.  MRI brain shows no stroke.  I have personally reviewed all radiology reports.   CT Angio Chest PE W and/or Wo Contrast  Result Date: 01/22/2023 CLINICAL DATA:  Pulmonary embolism (PE) suspected, low to intermediate prob, positive D-dimer. Dizziness, cough EXAM: CT ANGIOGRAPHY CHEST WITH CONTRAST TECHNIQUE: Multidetector CT imaging of the chest was performed using the standard protocol during bolus administration of intravenous contrast. Multiplanar CT image reconstructions and MIPs were obtained to evaluate the vascular anatomy. RADIATION DOSE REDUCTION: This exam was performed according to the departmental dose-optimization program which includes automated exposure control, adjustment of the mA and/or kV according to patient size and/or use of iterative reconstruction technique. CONTRAST:  34mL OMNIPAQUE IOHEXOL 350 MG/ML SOLN COMPARISON:  None Available. FINDINGS: Cardiovascular: Heart is normal size. Aorta is normal caliber. No filling defects in the pulmonary arteries to suggest pulmonary emboli. Extensive coronary artery and scattered aortic calcifications. Mediastinum/Nodes: No mediastinal, hilar, or axillary adenopathy. Trachea and esophagus are unremarkable. Thyroid unremarkable. Lungs/Pleura: No confluent opacities or effusions. Upper Abdomen: No acute findings Musculoskeletal:  Chest wall soft tissues are unremarkable. No acute bony abnormality. Review of the MIP images confirms the above findings. IMPRESSION: No evidence of pulmonary embolus. Extensive coronary artery disease. No acute cardiopulmonary disease. Aortic Atherosclerosis (ICD10-I70.0). Electronically Signed   By: Charlett Nose M.D.   On: 01/22/2023 03:36   MR BRAIN WO CONTRAST  Result Date: 01/22/2023 CLINICAL DATA:  Initial evaluation for neuro deficit, stroke suspected. EXAM: MRI HEAD WITHOUT CONTRAST TECHNIQUE: Multiplanar, multiecho pulse sequences of the brain and surrounding structures were obtained without intravenous contrast. COMPARISON:  Prior CT from 07/31/2022. FINDINGS: Brain: Cerebral volume within normal limits. No significant cerebral white matter disease for age. No abnormal foci of restricted diffusion to suggest acute or subacute ischemia. Gray-white matter differentiation maintained. No areas of chronic cortical infarction. No acute or chronic intracranial blood products. No mass lesion, midline shift or mass effect. No hydrocephalus or extra-axial fluid collection. Pituitary gland and suprasellar region within normal limits. Vascular: Major intracranial vascular flow voids are maintained. Asymmetric FLAIR hyperintensity involving the right transverse sinus without susceptibility artifact, likely reflecting slow/sluggish flow. Skull and upper cervical spine: Craniocervical junction within normal limits. Bone marrow signal intensity normal. No scalp soft tissue abnormality. Sinuses/Orbits: Prior bilateral ocular lens replacement. Globes and orbital soft tissues demonstrate no acute finding. Paranasal sinuses are largely clear. No mastoid effusion. Other: None. IMPRESSION: Normal brain MRI for age. No acute intracranial abnormality identified. Electronically Signed   By: Rise Mu M.D.   On: 01/22/2023 02:24   DG Chest 2 View  Result Date: 01/21/2023 CLINICAL DATA:  Dizziness. EXAM: CHEST - 2  VIEW COMPARISON:  July 31, 2022 FINDINGS: The heart size and mediastinal contours are within normal limits. Both lungs are clear. Radiopaque surgical clips are seen within the right upper quadrant. The visualized skeletal structures are unremarkable. IMPRESSION: No active cardiopulmonary disease. Electronically Signed   By: Aram Candela M.D.   On: 01/21/2023 23:48     PROCEDURES:  Critical Care performed: No      .1-3 Lead EKG Interpretation  Performed by: Alexandra Posadas, Layla Maw, DO Authorized by: Mylo Choi, Layla Maw, DO     Interpretation: abnormal     ECG rate:  102   ECG rate assessment: tachycardic     Rhythm: sinus tachycardia  Ectopy: none     Conduction: normal       IMPRESSION / MDM / ASSESSMENT AND PLAN / ED COURSE  I reviewed the triage vital signs and the nursing notes.    Patient with multiple complaints.  Complaining of intermittent headaches, dizziness, chest pain, shortness of breath, fevers, cough, vomiting, diarrhea, numbness, weakness.  The patient is on the cardiac monitor to evaluate for evidence of arrhythmia and/or significant heart rate changes.   DIFFERENTIAL DIAGNOSIS (includes but not limited to):   CVA, intracranial hemorrhage, ACS, PE, pneumonia, viral URI, doubt dissection, pneumothorax   Patient's presentation is most consistent with acute presentation with potential threat to life or bodily function.   PLAN: Will obtain CBC, BMP, troponin x 2, D-dimer, coags, chest x-ray, MRI brain.  EKG shows sinus tachycardia without other arrhythmia or ischemia.  Will give Toradol for pain control.   MEDICATIONS GIVEN IN ED: Medications  ketorolac (TORADOL) 30 MG/ML injection 30 mg (30 mg Intravenous Given 01/22/23 0137)  ondansetron (ZOFRAN) injection 4 mg (4 mg Intravenous Given 01/22/23 0137)  sodium chloride 0.9 % bolus 1,000 mL (0 mLs Intravenous Stopped 01/22/23 0321)  iohexol (OMNIPAQUE) 350 MG/ML injection 75 mL (75 mLs Intravenous Contrast Given  01/22/23 0324)  meclizine (ANTIVERT) tablet 25 mg (25 mg Oral Given 01/22/23 0352)  sodium chloride 0.9 % bolus 1,000 mL (1,000 mLs Intravenous New Bag/Given 01/22/23 0356)     ED COURSE: Patient's labs show minimally elevated troponins but they are downtrending.  Hemoglobin of 12.2.  Normal electrolytes.  Urine shows no ketones or sign of infection.  D-dimer slightly elevated so CTA of the chest was obtained and reviewed/interpreted by myself and the radiologist and shows no acute abnormality including PE or widened mediastinum.  MRI of the brain also reviewed and interpreted by myself and radiologist and shows no acute stroke.  Will check orthostatic vital signs after liter of fluids and reassess.  3:50 AM  Pt's blood pressure dropped slightly with standing and he is symptomatic.  Will give second liter of fluid but I suspect that this is likely more peripheral vertigo.  Will give meclizine and reassess.  5:07 AM  Pt reports feeling better.  Feels more steady on his feet and able to ambulate without assistance.  Tolerating p.o.  Will discharge with meclizine and ENT follow-up.  Recommend close PCP follow-up as well.   At this time, I do not feel there is any life-threatening condition present. I reviewed all nursing notes, vitals, pertinent previous records.  All lab and urine results, EKGs, imaging ordered have been independently reviewed and interpreted by myself.  I reviewed all available radiology reports from any imaging ordered this visit.  Based on my assessment, I feel the patient is safe to be discharged home without further emergent workup and can continue workup as an outpatient as needed. Discussed all findings, treatment plan as well as usual and customary return precautions.  They verbalize understanding and are comfortable with this plan.  Outpatient follow-up has been provided as needed.  All questions have been answered.  CONSULTS:  none   OUTSIDE RECORDS REVIEWED: Reviewed last PCP  note with Demetrius Revel on 01/03/2023.  Patient had normal carotid Dopplers on 01/03/2023.       FINAL CLINICAL IMPRESSION(S) / ED DIAGNOSES   Final diagnoses:  Dizziness  Chest pain, unspecified type     Rx / DC Orders   ED Discharge Orders          Ordered  meclizine (ANTIVERT) 25 MG tablet  3 times daily PRN        01/22/23 0507             Note:  This document was prepared using Dragon voice recognition software and may include unintentional dictation errors.   Bridie Colquhoun, Delice Bison, DO 01/22/23 [phone]

## 2023-01-22 NOTE — ED Notes (Signed)
Pt back to CT

## 2023-02-07 IMAGING — DX DG CHEST 1V
1 series · 1 of 1 positions shown · non-contrast
Comparison: Chest radiograph dated 02/13/2022.

CLINICAL DATA: Chest pain.

EXAM:
CHEST  1 VIEW

[chest ap]
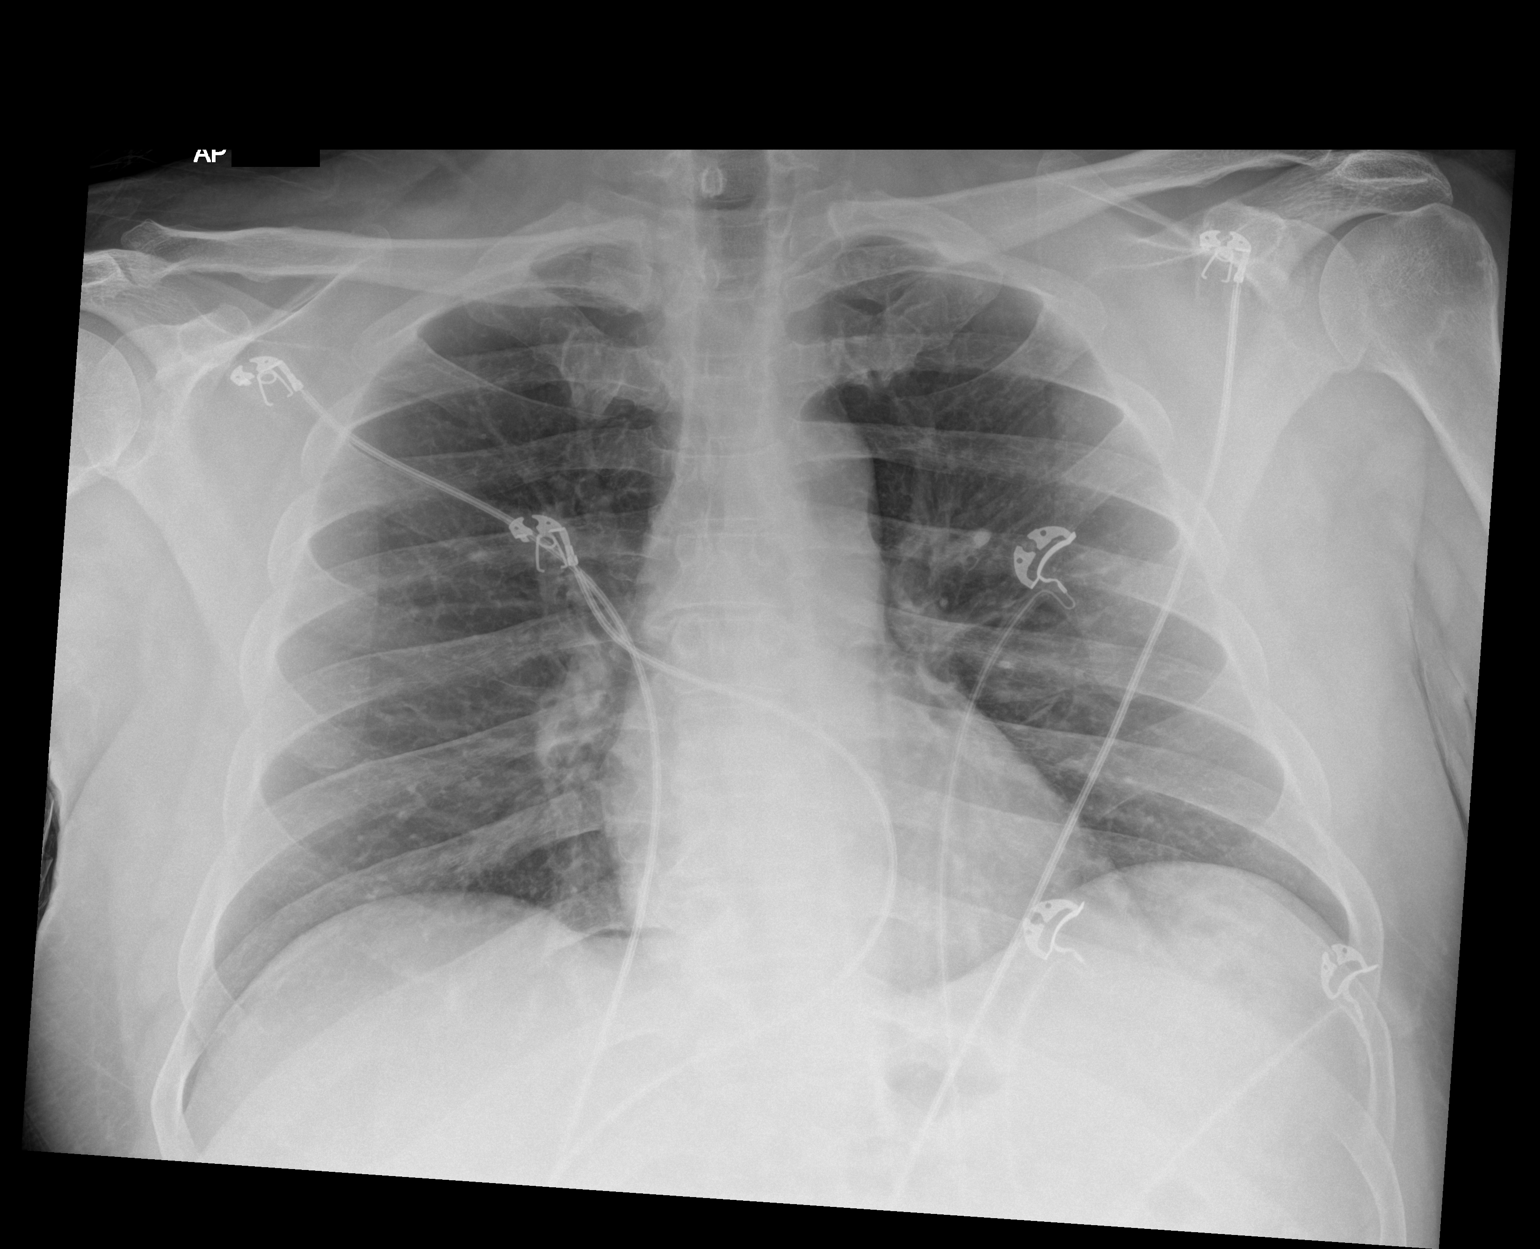

[1 of 1 positions shown; findings below may reference images not displayed]

FINDINGS: The heart size and mediastinal contours are within normal limits.
Both lungs are clear. The visualized skeletal structures are
unremarkable.
IMPRESSION: No active disease.
